# Patient Record
Sex: Female | Born: 1995 | Race: Black or African American | Hispanic: No | Marital: Single | Smoking: Never smoker
Health system: Southern US, Community
[De-identification: ages and names within clinical notes are randomized; demographics above are authoritative.]

## PROBLEM LIST (undated history)

## (undated) DIAGNOSIS — E063 Autoimmune thyroiditis: Secondary | ICD-10-CM

## (undated) DIAGNOSIS — L732 Hidradenitis suppurativa: Secondary | ICD-10-CM

## (undated) DIAGNOSIS — L83 Acanthosis nigricans: Secondary | ICD-10-CM

## (undated) DIAGNOSIS — E049 Nontoxic goiter, unspecified: Secondary | ICD-10-CM

## (undated) DIAGNOSIS — R519 Headache, unspecified: Secondary | ICD-10-CM

## (undated) DIAGNOSIS — R51 Headache: Secondary | ICD-10-CM

## (undated) DIAGNOSIS — R1013 Epigastric pain: Secondary | ICD-10-CM

## (undated) DIAGNOSIS — J45909 Unspecified asthma, uncomplicated: Secondary | ICD-10-CM

## (undated) DIAGNOSIS — E301 Precocious puberty: Secondary | ICD-10-CM

## (undated) DIAGNOSIS — R7303 Prediabetes: Secondary | ICD-10-CM

## (undated) DIAGNOSIS — I10 Essential (primary) hypertension: Secondary | ICD-10-CM

## (undated) DIAGNOSIS — E119 Type 2 diabetes mellitus without complications: Secondary | ICD-10-CM

## (undated) DIAGNOSIS — O24419 Gestational diabetes mellitus in pregnancy, unspecified control: Secondary | ICD-10-CM

## (undated) HISTORY — DX: Autoimmune thyroiditis: E06.3

## (undated) HISTORY — DX: Essential (primary) hypertension: I10

## (undated) HISTORY — DX: Headache, unspecified: R51.9

## (undated) HISTORY — DX: Epigastric pain: R10.13

## (undated) HISTORY — PX: KNEE ARTHROSCOPY: SUR90

## (undated) HISTORY — DX: Acanthosis nigricans: L83

## (undated) HISTORY — DX: Precocious puberty: E30.1

## (undated) HISTORY — DX: Nontoxic goiter, unspecified: E04.9

## (undated) HISTORY — DX: Hidradenitis suppurativa: L73.2

## (undated) HISTORY — DX: Prediabetes: R73.03

## (undated) HISTORY — DX: Headache: R51

---
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
## 2016-01-25 ENCOUNTER — Ambulatory Visit (INDEPENDENT_AMBULATORY_CARE_PROVIDER_SITE_OTHER): Payer: Medicaid Other | Admitting: Pediatrics

## 2016-01-25 ENCOUNTER — Encounter: Payer: Self-pay | Admitting: Pediatrics

## 2016-01-25 VITALS — BP 114/60 | HR 96 | Ht 68.25 in | Wt 247.8 lb

## 2016-01-25 DIAGNOSIS — G43009 Migraine without aura, not intractable, without status migrainosus: Secondary | ICD-10-CM | POA: Diagnosis not present

## 2016-01-25 MED ORDER — RELPAX 40 MG PO TABS: ORAL_TABLET | ORAL | Status: DC

## 2016-01-25 NOTE — Progress Notes (Signed)
Patient: Kathryn [name] MRN: [id number] Sex: female DOB: [birth year]  Provider: Deetta Perla, MD Location of Care: [facility]  Note type: Routine return visit  History of Present Illness: Referral Source: Dr. Dahlia Byes History from: patient and [facility] chart Chief Complaint: Migraines  [name] is a 20 y.o. female who returns on January 25, 2016 for the first time since Apr 05, 2015.  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  At the time I last saw her, she had new daily persistent headache.  We had to admit her to the hospital for DHE protocol.  Interestingly following that, her headaches markedly improved.  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  Her grade point average her freshman year is 3.3, which I think is very good as she adjusts from high school to college.    She tells me that her headaches occur about once or twice a week, but they are not particularly severe.  She has used topiramate as an abortive medication when it should be used as a preventative.  She is not taking it on a regular daily basis.  Given that she is not having frequent migraines, I told her to stop a topiramate because she was not taking it daily.  She had one bad headache week or so ago that was associated with sinusitis with severe headaches for at least two days until the antibiotic began to work.  She has treated her headaches with ibuprofen and when severe, with Relpax.  This is provided relief from her headaches over period of about an hour or so.  Her other medical problems include obesity, acanthosis, and autoimmune thyroiditis.  She has a prediabetic state, asthma and allergic rhinitis, and has had hypertension in the past.  She has gained 20 pounds with no change in height in the past 10 months.  I did not dwell on that, but it is in all likelihood her most significant medical  problem.  Review of Systems: 12 system review was assessed and except as noted above was otherwise normal  Past Medical History Diagnosis Date  . Pre-diabetes   . Hypertension   . Isosexual precocity   . Acanthosis nigricans   . Goiter   . Dyspepsia   . Thyroiditis, autoimmune   . Headache   . Asthma    Hospitalizations: No., Head Injury: No., Nervous System Infections: No., Immunizations up to date: Yes.    Birth History Term infant born at [redacted] weeks gestational age to a 19 year old g 1 p 0 female.  Behavior History none  Surgical History History reviewed. No pertinent past surgical history.  Family History family history includes Alcohol abuse in her father; Asthma in her brother, mother, and sister; COPD in her maternal grandfather; Cancer in her maternal grandmother; Depression in her maternal grandfather; Drug abuse in her father; Heart disease in her paternal grandmother; Hypertension in her maternal grandmother and paternal grandmother; Learning disabilities in her maternal grandfather; Mental illness in her brother. Family history is negative for migraines, seizures, intellectual disabilities, blindness, deafness, birth defects, chromosomal disorder, or autism.  Social History . Marital Status: Single    Spouse Name: N/A  . Number of Children: N/A  . Years of Education: N/A   Social History Main Topics  . Smoking status: Passive Smoke Exposure - Never Smoker  . Smokeless tobacco: Never Used  . Alcohol Use: No  .  Drug Use: No  . Sexual Activity: Yes    Birth Control/ Protection: Condom   Social History Narrative    [name] is a [employer]man at [employer]. She is doing well. She lives with her mother. She has 4 siblings, 24, 24, 8, and 19 yo. She enjoys dancing and exercise.   No Known Allergies  Physical Exam BP 114/60 mmHg  Pulse 96  Ht 5' 8.25" (1.734 m)  Wt 247 lb 12.8 oz (112.401 kg)  BMI 37.38 kg/m2  LMP 01/03/2016  (Approximate)  General: alert, well developed, obese, in no acute distress, black hair, brown eyes, right handed Head: normocephalic, no dysmorphic features Ears, Nose and Throat: Otoscopic: tympanic membranes normal; pharynx: oropharynx is pink without exudates or tonsillar hypertrophy Neck: supple, full range of motion, no cranial or cervical bruits Respiratory: auscultation clear Cardiovascular: no murmurs, pulses are normal Musculoskeletal: no skeletal deformities or apparent scoliosis Skin: no rashes or neurocutaneous lesions  Neurologic Exam  Mental Status: alert; oriented to person, place and year; knowledge is normal for age; language is normal Cranial Nerves: visual fields are full to double simultaneous stimuli; extraocular movements are full and conjugate; pupils are round reactive to light; funduscopic examination shows sharp disc margins with normal vessels; symmetric facial strength; midline tongue and uvula; air conduction is greater than bone conduction bilaterally Motor: Normal strength, tone and mass; good fine motor movements; no pronator drift Sensory: intact responses to cold, vibration, proprioception and stereognosis Coordination: good finger-to-nose, rapid repetitive alternating movements and finger apposition Gait and Station: normal gait and station: patient is able to walk on heels, toes and tandem without difficulty; balance is adequate; Romberg exam is negative; Gower response is negative Reflexes: symmetric and diminished bilaterally; no clonus; bilateral flexor plantar responses  Assessment 1.  Migraine without aura and without status migrainosus, not intractable, G43.009.  Discussion I am pleased that migraines have significantly declined.  I asked her to keep a daily prospective headache calendar so that she will know how often she has severe headaches, which will allow me to properly treat her.  Plan I recommended that she discontinue topiramate.  I wrote  a prescription for Relpax, trade drug.  I asked her to sign up for my chart so that she could communicate with me when she is at school.  She will return to see me in five months before she returns to school.  I spent 30 minutes face-to-face time with Aireana, more than half of it in consultation.   Medication List   This list is accurate as of: 01/25/16 11:59 PM.       albuterol (2.5 MG/3ML) 0.083% nebulizer solution  Commonly known as:  PROVENTIL  Take 2.5 mg by nebulization every 6 (six) hours as needed for wheezing or shortness of breath.     PROAIR HFA 108 (90 Base) MCG/ACT inhaler  Generic drug:  albuterol  Inhale 2 puffs into the lungs every 4 (four) hours as needed for wheezing or shortness of breath.     beclomethasone 80 MCG/ACT inhaler  Commonly known as:  QVAR  Inhale 2 puffs into the lungs 2 (two) times daily.     cetirizine 10 MG tablet  Commonly known as:  ZYRTEC  Take 10 mg by mouth daily.     MULTIVITAMIN GUMMIES ADULTS PO  Take 1 tablet by mouth daily.     PATADAY 0.2 % Soln  Generic drug:  Olopatadine HCl  Place 1 drop into both eyes daily.  RELPAX 40 MG tablet  Generic drug:  eletriptan  Take one tablet at onset of migraine with 400 mg of ibuprofen may repeat in 2 hours if headache persists or recurs.      The medication list was reviewed and reconciled. All changes or newly prescribed medications were explained.  A complete medication list was provided to the patient/caregiver.  Deetta Perla MD

## 2016-02-06 ENCOUNTER — Other Ambulatory Visit: Payer: Self-pay | Admitting: Family

## 2016-06-04 ENCOUNTER — Encounter: Payer: Self-pay | Admitting: Sports Medicine

## 2016-06-04 ENCOUNTER — Ambulatory Visit (INDEPENDENT_AMBULATORY_CARE_PROVIDER_SITE_OTHER): Payer: Medicaid Other | Admitting: Sports Medicine

## 2016-06-04 ENCOUNTER — Ambulatory Visit (INDEPENDENT_AMBULATORY_CARE_PROVIDER_SITE_OTHER): Payer: Medicaid Other

## 2016-06-04 VITALS — BP 128/71 | HR 88 | Resp 16 | Ht 68.0 in | Wt 250.0 lb

## 2016-06-04 DIAGNOSIS — Q6650 Congenital pes planus, unspecified foot: Secondary | ICD-10-CM

## 2016-06-04 DIAGNOSIS — M79673 Pain in unspecified foot: Secondary | ICD-10-CM

## 2016-06-04 MED ORDER — DICLOFENAC SODIUM 75 MG PO TBEC: 75.0000 mg | DELAYED_RELEASE_TABLET | Freq: Every day | ORAL | 0 refills | Status: DC

## 2016-06-04 NOTE — Patient Instructions (Signed)
Flat Feet Having flat feet is a common condition. One foot or both might be affected. People of any age can have flat feet. In fact, everyone is born with them. But most of the time, the foot gradually develops an arch. That is the curve on the bottom of the foot that creates a gap between the foot and the ground. An arch usually develops in childhood. Sometimes, though, an arch never develops and the foot stays flat on the bottom. Other times, an arch develops but later collapses (caves in). That is what gives the condition its nickname, "fallen arches." The medical term for flat feet is pes planus. Some people have flat feet their whole life and have no problems. For others, the condition causes pain and needs to be corrected.  CAUSES   A problem with the foot's soft tissue; tendons and ligaments could be loose.  This can cause what is called flexible flat feet. That means the shape of the foot changes with pressure. When standing on the toes, a curved arch can be seen. When standing on the ground, the foot is flat.  Wear and tear. Sometimes arches simply flatten over time.  Damage to the posterior tibial tendon. This is the tendon that goes from the inside of the ankle to the bones in the middle of the foot. It is the main support for the arch. If the tendon is injured, stretched or torn, the arch might flatten.  Tarsal coalition. With this condition, two or more bones in the foot are joined together (fused ) during development in the womb. This limits movement and can lead to a flat foot. SYMPTOMS   The foot is even with the ground from toe to heel. Your caregiver will look closely at the inside of the foot while you are standing.  Pain along the bottom of the foot. Some people describe the pain as tightness.  Swelling on the inside of the foot or ankle.  Changes in the way you walk (gait).  The feet lean inward, starting at the ankle (pronation). DIAGNOSIS  To decide if a child or  adult has flat feet, a healthcare provider will probably:  Do a physical examination. This might include having the person stand on his or her toes and then stand normally. The caregiver will also hold the foot and put pressure on the foot in different directions.  Check the person's shoes. The pattern of wear on the soles can offer clues.  Order images (pictures) of the foot. They can help identify the cause of any pain. They also will show injuries to bones or tendons that could be causing the condition. The images can come from:  X-rays.  Computed tomography (CT) scan. This combines X-ray and a computer.  Magnetic resonance imaging (MRI). This uses magnets, radio waves and a computer to take a picture of the foot. It is the best technique to evaluate tendons, ligaments and muscles. TREATMENT   Flexible flat feet usually are painless. Most of the time, gait is not affected. Most children grow out of the condition. Often no treatment is needed. If there is pain, treatment options include:  Orthotics. These are inserts that go in the shoes. They add support and shape to the feet. An orthotic is custom-made from a mold of the foot.  Shoes. Not all shoes are the same. People with flat feet need arch support. However, too much can be painful. It is important to find shoes that offer the right amount   of support. Athletes, especially runners, may need to try shoes made just for people with flatter feet.  Medication. For pain, only take over-the-counter medicine for pain, discomfort, as directed by your caregiver.  Rest. If the feet start to hurt, cut back on the exercise which increases the pain. Use common sense.  For damage to the posterior tibial tendon, options include:  Orthotics. Also adding a wedge on the inside edge may help. This can relieve pressure on the tendon.  Ankle brace, boot or cast. These supports can ease the load on the tendon while it heals.  Surgery. If the tendon is  torn, it might need to be repaired.  For tarsal coalition, similar options apply:  Pain medication.  Orthotics.  A cast and crutches. This keeps weight off the foot.  Physical therapy.  Surgery to remove the bone bridge joining the two bones together. PROGNOSIS  In most people, flat feet do not cause pain or problems. People can go about their normal activities. However, if flat feet are painful, they can and should be treated. Treatment usually relieves the pain. HOME CARE INSTRUCTIONS   Take any medications prescribed by the healthcare provider. Follow the directions carefully.  Wear, or make sure a child wears, orthotics or special shoes if this was suggested. Be sure to ask how often and for how long they should be worn.  Do any exercises or therapy treatments that were suggested.  Take notes on when the pain occurs. This will help healthcare providers decide how to treat the condition.  If surgery is needed, be sure to find out if there is anything that should or should not be done before the operation. SEEK MEDICAL CARE IF:   Pain worsens in the foot or lower leg.  Pain disappears after treatment, but then returns.  Walking or simple exercise becomes difficult or causes foot pain.  Orthotics or special shoes are uncomfortable or painful.   This information is not intended to replace advice given to you by your health care provider. Make sure you discuss any questions you have with your health care provider.   Document Released: 08/25/2009 Document Revised: 01/20/2012 Document Reviewed: 04/26/2015 Elsevier Interactive Patient Education 2016 [employer]  

## 2016-06-05 NOTE — Progress Notes (Signed)
Subjective: Kathryn [name] is a 20 y.o. female patient who presents to office for evaluation of bilateral foot pain. Patient complains of progressive changes to feet over the years from flatfoot type. Patient admits pain that starts as fatigue in the medial arch. Admits also to swelling at end of the day at feet and ankles. Pain is now interferring with daily activities. States that it is worse after work.  Patient has tried inserts in the past that seemed to help for a little bit. Patient denies any other pedal complaints.   Denies history of arthridities or history of birth defects. All normal birth and devlopemental milestones.   Patient Active Problem List   Diagnosis Date Noted  . Status migrainosus 04/17/2015  . 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 New daily persistent headache 09/09/2014  . Migraine without aura and without status migrainosus, not intractable 09/09/2014  . Obesity 09/09/2014  . Isosexual precocity   . Acanthosis nigricans   . Goiter   . Dyspepsia   . Thyroiditis, autoimmune   . Goiter, unspecified 03/04/2011  . Pre-diabetes 03/04/2011  . Hypertension 03/04/2011   Current Outpatient Prescriptions on File Prior to Visit  Medication Sig Dispense Refill  . albuterol (PROVENTIL) (2.5 MG/3ML) 0.083% nebulizer solution Take 2.5 mg by nebulization every 6 (six) hours as needed for wheezing or shortness of breath.    . beclomethasone (QVAR) 80 MCG/ACT inhaler Inhale 2 puffs into the lungs 2 (two) times daily.    . cetirizine (ZYRTEC) 10 MG tablet Take 10 mg by mouth daily.    . Multiple Vitamins-Minerals (MULTIVITAMIN GUMMIES ADULTS PO) Take 1 tablet by mouth daily.    Marland Kitchen PATADAY 0.2 % SOLN Place 1 drop into both eyes daily.   6  . PROAIR HFA 108 (90 BASE) MCG/ACT inhaler Inhale 2 puffs into the lungs every 4 (four) hours as needed for wheezing or shortness of breath.   0  . RELPAX 40 MG tablet Take one tablet at onset of migraine with 400 mg of ibuprofen may repeat in  2 hours if headache persists or recurs. 10 tablet 5  . topiramate (TOPAMAX) 25 MG tablet TAKE 2 TABLETS IN THE MORNING AND 3 TABLETS AT NIGHTTIME 150 tablet 3   No current facility-administered medications on file prior to visit.    No Known Allergies   Objective:  General: Alert and oriented x3 in no acute distress  Dermatology: No open lesions bilateral lower extremities, no webspace macerations, no ecchymosis bilateral, all nails x 10 are well manicured.  Vascular: Dorsalis Pedis and Posterior Tibial pedal pulses 2/4, Capillary Fill Time 3 seconds, (+) pedal hair growth bilateral, no edema bilateral lower extremities, Temperature gradient within normal limits.  Neurology: Michaell Cowing sensation intact via light touch bilateral, Protective sensation intact  with Semmes Weinstein Monofilament to all pedal sites, Position sense intact, vibratory intact bilateral, Deep tendon reflexes within normal limits bilateral, No babinski sign present bilateral. (-) Tinels sign bilateral.   Musculoskeletal: Minimal tenderness with palpation along medial arch, No tenderness to medial fascial band, Minimal tenderness along Posterior tibial tendon course with minimal medial soft tissue buldge noted, there is mild decreased ankle rom with knee extending  vs flexed resembling gastroc equnius bilateral, Subtalar joint range of motion is within normal limits, there is mild 1st ray hypermobility noted bilateral, MTPJ ROM within normal limits, there is medial arch collapse bilateral on weightbearing exam, moderate RF valgus bilateral, + "too-many toes" sign appreciated, able to perform heel rise test without  pain.  Gait: Non-Antalgic gait with increased medial arch collapse and pronatory influence noted bilateral with medial 1st MPJ roll off in toe-off.   Xrays Right and Left foot:  Normal osseous mineralization. Joint spaces preserved. No fracture/dislocation/boney destruction. Mild 1st ray elevatus present. Increased  Talar head uncovering present. Anterior break in cyma line with midtarsal breach present. Increased Talar declination present. Decreased calcaneal inclination present.  No soft tissue abnormalities or radiopaque foreign bodies.   Assessment and Plan: Problem List Items Addressed This Visit    None    Visit Diagnoses    Foot pain, unspecified laterality    -  Primary   Relevant Medications   diclofenac (VOLTAREN) 75 MG EC tablet   Other Relevant Orders   DG Foot 2 Views Left   DG Foot 2 Views Right   Congenital pes planus, unspecified laterality       Relevant Medications   diclofenac (VOLTAREN) 75 MG EC tablet      -Complete examination performed -Xrays reviewed -Discussed treatement options; discussed pes planus deformity;conservative and  surgical  -Prescribed diclofenac to take as instructed -Rx Orthotics from [facility] -Recommend good supportive shoes -Recommend daily stretching and icing and Epsom salt soaks as needed -Recommend Motrin or Tylenol as needed -Patient to return to office as needed or sooner if condition worsens.  Asencion Islam, DPM

## 2016-06-14 ENCOUNTER — Telehealth: Payer: Self-pay | Admitting: *Deleted
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
## 2016-06-14 NOTE — Telephone Encounter (Signed)
A copy should be on file of the prescription from [facility] for [place] to give her a new copy. If not, can write [facility] custom orthotics. Medical justification symptomatic pes planus. Thanks Dr. Marylene Land

## 2016-06-17 ENCOUNTER — Other Ambulatory Visit: Payer: Self-pay | Admitting: Family

## 2016-06-17 ENCOUNTER — Other Ambulatory Visit: Payer: Self-pay | Admitting: Sports Medicine

## 2016-06-17 DIAGNOSIS — Q6650 Congenital pes planus, unspecified foot: Secondary | ICD-10-CM

## 2016-06-17 DIAGNOSIS — M79673 Pain in unspecified foot: Secondary | ICD-10-CM

## 2016-06-19 ENCOUNTER — Telehealth: Payer: Self-pay | Admitting: *Deleted

## 2016-06-19 MED ORDER — DICLOFENAC SODIUM 75 MG PO TBEC: DELAYED_RELEASE_TABLET | ORAL | 1 refills | Status: DC

## 2016-06-19 NOTE — Telephone Encounter (Signed)
Pt requested refill of the Diclofenac.  I informed pt Dr. Theodoro DoingStover okayed the refill +one additional, pt needs an appt prior to future refills.

## 2016-06-30 ENCOUNTER — Other Ambulatory Visit: Payer: Self-pay | Admitting: Sports Medicine

## 2016-06-30 DIAGNOSIS — M79673 Pain in unspecified foot: Secondary | ICD-10-CM

## 2016-06-30 DIAGNOSIS — Q6650 Congenital pes planus, unspecified foot: Secondary | ICD-10-CM

## 2016-07-11 ENCOUNTER — Telehealth: Payer: Self-pay | Admitting: *Deleted

## 2016-07-11 NOTE — Telephone Encounter (Signed)
FAXED RX FOR DMA, MEDICAL NECESSITY FORMS AND 06/04/2106 CLINICAL NOTE TO [facility].

## 2016-07-12 ENCOUNTER — Encounter: Payer: Self-pay | Admitting: Pediatrics

## 2016-10-01 DIAGNOSIS — Y939 Activity, unspecified: Secondary | ICD-10-CM | POA: Insufficient documentation

## 2016-10-01 DIAGNOSIS — Z7722 Contact with and (suspected) exposure to environmental tobacco smoke (acute) (chronic): Secondary | ICD-10-CM | POA: Insufficient documentation

## 2016-10-01 DIAGNOSIS — M7051 Other bursitis of knee, right knee: Secondary | ICD-10-CM | POA: Insufficient documentation

## 2016-10-01 DIAGNOSIS — I10 Essential (primary) hypertension: Secondary | ICD-10-CM | POA: Insufficient documentation

## 2016-10-01 DIAGNOSIS — J45909 Unspecified asthma, uncomplicated: Secondary | ICD-10-CM | POA: Insufficient documentation

## 2016-10-02 ENCOUNTER — Emergency Department (HOSPITAL_COMMUNITY): Payer: Self-pay

## 2016-10-02 ENCOUNTER — Encounter (HOSPITAL_COMMUNITY): Payer: Self-pay | Admitting: Emergency Medicine

## 2016-10-02 ENCOUNTER — Emergency Department (HOSPITAL_COMMUNITY)
Admission: EM | Admit: 2016-10-02 | Discharge: 2016-10-02 | Disposition: A | Discharge status: Home / Self Care | Payer: Self-pay | Attending: Emergency Medicine | Admitting: Emergency Medicine

## 2016-10-02 DIAGNOSIS — M7050 Other bursitis of knee, unspecified knee: Secondary | ICD-10-CM

## 2016-10-02 MED ORDER — PREDNISONE 20 MG PO TABS: 60.0000 mg | ORAL_TABLET | Freq: Once | ORAL | Status: DC

## 2016-10-02 MED ORDER — OXYCODONE-ACETAMINOPHEN 5-325 MG PO TABS
2.0000 | ORAL_TABLET | Freq: Once | ORAL | Status: AC
  Administered 2016-10-02: 2 via ORAL
  Filled 2016-10-02: qty 2

## 2016-10-02 MED ORDER — PREDNISONE 20 MG PO TABS: 60.0000 mg | ORAL_TABLET | Freq: Every day | ORAL | 0 refills | Status: AC

## 2016-10-02 NOTE — Discharge Instructions (Signed)
Today, your x-ray does not show any fracture or effusion.  You're being treated for a bursitis which is an inflammation of the fluid sacs that lubricate your knee joint.  Please take the medication as directed until all tablets have been consumed.  Continue wearing the brace for comfort.  As the swelling decreases, you may need less support been given a referral to an orthopedic surgeon for further evaluation if you do not have total resolution of your symptoms in the next 1-2 weeks

## 2016-10-02 NOTE — ED Notes (Signed)
Patient is alert and oriented x3.  She was given DC instructions and follow up visit instructions.  Patient gave verbal understanding. She was DC ambulatory under her own power to home.  V/S stable.  He was not showing any signs of distress on DC 

## 2016-10-02 NOTE — ED Triage Notes (Signed)
Patient was at [facility] two weeks ago and they told her she has fluid on her knee. Patient is in pain on the right knee

## 2016-10-02 NOTE — ED Provider Notes (Signed)
[facility]-EMERGENCY DEPT Provider Note   CSN: [id number] Arrival date & time: 10/01/16  2346     History   Chief Complaint Chief Complaint  Patient presents with  . Joint Swelling    HPI Kathryn [name] is a 20 y.o. female.  This is a morbidly obese 20 year old female who noted 2 weeks ago that she had right knee swelling that is progressively gotten worse.  [facility] and given Naprosyn and hydrocodone, and a knee brace with ejections to follow-up, which she has not done as of yet due to school schedule.  Denies any DVT risk factors.  Denies trauma or previous injury to this knee.      Past Medical History:  Diagnosis Date  . Acanthosis nigricans   . Asthma   . Dyspepsia   . Goiter   . Headache   . Hypertension   . Isosexual precocity   . Pre-diabetes   . Thyroiditis, autoimmune     Patient Active Problem List   Diagnosis Date Noted  . Status migrainosus 04/17/2015  . 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 New daily persistent headache 09/09/2014  . Migraine without aura and without status migrainosus, not intractable 09/09/2014  . Obesity 09/09/2014  . Isosexual precocity   . Acanthosis nigricans   . Goiter   . Dyspepsia   . Thyroiditis, autoimmune   . Goiter, unspecified 03/04/2011  . Pre-diabetes 03/04/2011  . Hypertension 03/04/2011    History reviewed. No pertinent surgical history.  OB History    No data available       Home Medications    Prior to Admission medications   Medication Sig Start Date End Date Taking? Authorizing Provider  albuterol (PROVENTIL) (2.5 MG/3ML) 0.083% nebulizer solution Take 2.5 mg by nebulization every 6 (six) hours as needed for wheezing or shortness of breath.    Historical Provider, MD  beclomethasone (QVAR) 80 MCG/ACT inhaler Inhale 2 puffs into the lungs 2 (two) times daily.    Historical Provider, MD  cetirizine (ZYRTEC) 10 MG tablet Take 10 mg by mouth daily.    Historical  Provider, MD  diclofenac (VOLTAREN) 75 MG EC tablet Take 1 tablet (75 mg total) by mouth daily. 06/04/16   Asencion Islam, DPM  diclofenac (VOLTAREN) 75 MG EC tablet Take one tablet daily. 06/19/16   Asencion Islam, DPM  Multiple Vitamins-Minerals (MULTIVITAMIN GUMMIES ADULTS PO) Take 1 tablet by mouth daily.    Historical Provider, MD  PATADAY 0.2 % SOLN Place 1 drop into both eyes daily.  11/14/14   Historical Provider, MD  predniSONE (DELTASONE) 20 MG tablet Take 3 tablets (60 mg total) by mouth daily with breakfast. 10/02/16 10/07/16  Earley Favor, NP  PROAIR HFA 108 (90 BASE) MCG/ACT inhaler Inhale 2 puffs into the lungs every 4 (four) hours as needed for wheezing or shortness of breath.  02/24/15   Historical Provider, MD  RELPAX 40 MG tablet Take one tablet at onset of migraine with 400 mg of ibuprofen may repeat in 2 hours if headache persists or recurs. 01/25/16   Deetta Perla, MD  topiramate (TOPAMAX) 25 MG tablet TAKE 2 TABLETS IN THE MORNING AND 3 TABLETS AT NIGHTTIME 02/06/16   Elveria Rising, NP    Family History Family History  Problem Relation Age of Onset  . Heart disease Paternal Grandmother   . Hypertension Paternal Grandmother   . Asthma Mother   . Alcohol abuse Father   . Drug abuse Father   .  Asthma Sister   . Asthma Brother   . Mental illness Brother   . Cancer Maternal Grandmother   . Hypertension Maternal Grandmother   . COPD Maternal Grandfather   . Depression Maternal Grandfather   . Learning disabilities Maternal Grandfather     Social History Social History  Substance Use Topics  . Smoking status: Passive Smoke Exposure - Never Smoker  . Smokeless tobacco: Never Used  . Alcohol use No     Allergies   Patient has no known allergies.   Review of Systems Review of Systems  Constitutional: Negative for fever.  Musculoskeletal: Positive for joint swelling.  Neurological: Negative for numbness.  All other systems reviewed and are  negative.    Physical Exam Updated Vital Signs BP 129/70 (BP Location: Right Arm)   Pulse 89   Temp 98.3 F (36.8 C) (Oral)   Resp 17   Ht 5\' 9"  (1.753 m)   Wt 108 kg   LMP 09/17/2016 (Approximate)   SpO2 98%   BMI 35.15 kg/m   Physical Exam  Constitutional: She appears well-developed and well-nourished.  Eyes: Pupils are equal, round, and reactive to light.  Neck: Normal range of motion.  Cardiovascular: Normal rate.   Pulmonary/Chest: Effort normal.  Musculoskeletal: She exhibits edema and tenderness. She exhibits no deformity.       Right knee: She exhibits decreased range of motion, swelling and erythema. She exhibits no effusion, no deformity and no laceration. Tenderness found.       Legs: Neurological: She is alert.  Psychiatric: She has a normal mood and affect.  Nursing note and vitals reviewed.    ED Treatments / Results  Labs (all labs ordered are listed, but only abnormal results are displayed) Labs Reviewed - No data to display  EKG  EKG Interpretation None       Radiology Dg Knee 2 Views Right  Result Date: 10/02/2016 CLINICAL DATA:  No injury, patient states her knee has been swollen and painful for two weeks, states she was told at another facility that she had fluid on her knee, woke up tonight and is unable to bear weight, entire knee is painful EXAM: RIGHT KNEE - 1-2 VIEW COMPARISON:  None. FINDINGS: No evidence of fracture, dislocation, or joint effusion. No evidence of arthropathy or other focal bone abnormality. Soft tissues are unremarkable. IMPRESSION: Negative. Electronically Signed   By: Burman NievesWilliam  Stevens M.D.   On: 10/02/2016 03:24    Procedures Procedures (including critical care time)  Medications Ordered in ED Medications  predniSONE (DELTASONE) tablet 60 mg (not administered)  oxyCODONE-acetaminophen (PERCOCET/ROXICET) 5-325 MG per tablet 2 tablet (2 tablets Oral Given 10/02/16 0346)     Initial Impression / Assessment and  Plan / ED Course  I have reviewed the triage vital signs and the nursing notes.  Pertinent labs & imaging results that were available during my care of the patient were reviewed by me and considered in my medical decision making (see chart for details).  Clinical Course      X-rays been reviewed.  There is no effusion noted on x-ray due to body habitus.  Physical exam is difficult.  The joint is warm and swollen.  DVT risk factors are 0.  She'll be treated for bursitis with a 5 day burst of steroids, continued with her hydrocodone for pain control and the knee brace.  She's also been given a referral to orthopedic surgery for follow-up if she doesn't have complete resolution of her symptoms in  the next 1-2 weeks  Final Clinical Impressions(s) / ED Diagnoses   Final diagnoses:  Bursitis of knee, unspecified bursa, unspecified laterality    New Prescriptions New Prescriptions   PREDNISONE (DELTASONE) 20 MG TABLET    Take 3 tablets (60 mg total) by mouth daily with breakfast.     Earley FavorGail Sedrick Tober, NP 10/02/16 [id number]    Tomasita CrumbleAdeleke Oni, MD 10/02/16 [phone]

## 2016-10-30 ENCOUNTER — Ambulatory Visit (INDEPENDENT_AMBULATORY_CARE_PROVIDER_SITE_OTHER): Payer: Self-pay

## 2016-11-20 ENCOUNTER — Ambulatory Visit (INDEPENDENT_AMBULATORY_CARE_PROVIDER_SITE_OTHER): Payer: BLUE CROSS/BLUE SHIELD | Admitting: Pediatrics

## 2016-11-20 ENCOUNTER — Encounter (INDEPENDENT_AMBULATORY_CARE_PROVIDER_SITE_OTHER): Payer: Self-pay | Admitting: Pediatrics

## 2016-11-20 ENCOUNTER — Encounter: Payer: Self-pay | Admitting: Primary Care

## 2016-11-20 ENCOUNTER — Ambulatory Visit (INDEPENDENT_AMBULATORY_CARE_PROVIDER_SITE_OTHER): Payer: BLUE CROSS/BLUE SHIELD | Admitting: Primary Care

## 2016-11-20 VITALS — BP 110/70 | HR 92 | Ht 69.0 in | Wt 255.0 lb

## 2016-11-20 VITALS — BP 124/86 | HR 77 | Temp 98.2°F | Ht 69.0 in | Wt 255.4 lb

## 2016-11-20 DIAGNOSIS — J4540 Moderate persistent asthma, uncomplicated: Secondary | ICD-10-CM | POA: Diagnosis not present

## 2016-11-20 DIAGNOSIS — R7303 Prediabetes: Secondary | ICD-10-CM | POA: Diagnosis not present

## 2016-11-20 DIAGNOSIS — Z6837 Body mass index (BMI) 37.0-37.9, adult: Secondary | ICD-10-CM

## 2016-11-20 DIAGNOSIS — E6609 Other obesity due to excess calories: Secondary | ICD-10-CM | POA: Diagnosis not present

## 2016-11-20 DIAGNOSIS — K219 Gastro-esophageal reflux disease without esophagitis: Secondary | ICD-10-CM | POA: Diagnosis not present

## 2016-11-20 DIAGNOSIS — G43009 Migraine without aura, not intractable, without status migrainosus: Secondary | ICD-10-CM

## 2016-11-20 DIAGNOSIS — E049 Nontoxic goiter, unspecified: Secondary | ICD-10-CM | POA: Diagnosis not present

## 2016-11-20 DIAGNOSIS — I10 Essential (primary) hypertension: Secondary | ICD-10-CM

## 2016-11-20 DIAGNOSIS — E063 Autoimmune thyroiditis: Secondary | ICD-10-CM

## 2016-11-20 DIAGNOSIS — L83 Acanthosis nigricans: Secondary | ICD-10-CM

## 2016-11-20 DIAGNOSIS — R1013 Epigastric pain: Secondary | ICD-10-CM | POA: Diagnosis not present

## 2016-11-20 DIAGNOSIS — G4452 New daily persistent headache (NDPH): Secondary | ICD-10-CM | POA: Diagnosis not present

## 2016-11-20 LAB — COMPREHENSIVE METABOLIC PANEL
ALK PHOS: 76 U/L (ref 47–119)
ALT: 13 U/L (ref 0–35)
AST: 14 U/L (ref 0–37)
Albumin: 4.1 g/dL (ref 3.5–5.2)
BILIRUBIN TOTAL: 0.4 mg/dL (ref 0.2–1.2)
BUN: 10 mg/dL (ref 6–23)
CO2: 29 mEq/L (ref 19–32)
Calcium: 9.9 mg/dL (ref 8.4–10.5)
Chloride: 103 mEq/L (ref 96–112)
Creatinine, Ser: 0.77 mg/dL (ref 0.40–1.20)
GFR: 123.53 mL/min (ref >60.00)
GLUCOSE: 90 mg/dL (ref 70–99)
Potassium: 3.9 mEq/L (ref 3.5–5.1)
SODIUM: 138 meq/L (ref 135–145)
TOTAL PROTEIN: 8 g/dL (ref 6.0–8.3)

## 2016-11-20 LAB — TSH: TSH: 1.53 u[IU]/mL (ref 0.40–5.00)

## 2016-11-20 LAB — HEMOGLOBIN A1C: Hgb A1c MFr Bld: 6 % (ref 4.6–6.5)

## 2016-11-20 MED ORDER — TOPIRAMATE 25 MG PO TABS: ORAL_TABLET | ORAL | 5 refills | Status: DC

## 2016-11-20 MED ORDER — BECLOMETHASONE DIPROPIONATE 80 MCG/ACT IN AERS: 2.0000 | INHALATION_SPRAY | Freq: Two times a day (BID) | RESPIRATORY_TRACT | 5 refills | Status: DC

## 2016-11-20 MED ORDER — RELPAX 40 MG PO TABS: ORAL_TABLET | ORAL | 5 refills | Status: DC

## 2016-11-20 MED ORDER — PROAIR HFA 108 (90 BASE) MCG/ACT IN AERS: 2.0000 | INHALATION_SPRAY | Freq: Four times a day (QID) | RESPIRATORY_TRACT | 5 refills | Status: DC | PRN

## 2016-11-20 MED ORDER — OMEPRAZOLE 10 MG PO CPDR: 10.0000 mg | DELAYED_RELEASE_CAPSULE | Freq: Every day | ORAL | 1 refills | Status: DC

## 2016-11-20 NOTE — Assessment & Plan Note (Signed)
TSH checked today, normal.

## 2016-11-20 NOTE — Assessment & Plan Note (Signed)
TSH check today, normal.

## 2016-11-20 NOTE — Assessment & Plan Note (Signed)
A1C of 6.0 on today's labs. Strongly encouraged she improve her diet and start exercising. Follow up in 6 months.

## 2016-11-20 NOTE — Assessment & Plan Note (Signed)
Discussed to start exercising and improve diet. Continue to monitor.

## 2016-11-20 NOTE — Patient Instructions (Signed)
Your asthma is not well controlled.   Start using Qvar inhaler. Inhale 2 puffs into the lungs twice daily, everyday.  Use the albuterol every 6 hours as needed for wheezing/shortness of breath. Use this only as needed. If you start requiring this medication more than 2-3 times weekly, then you need to notify me.  I sent refills for omeprazole to your pharmacy.  Complete lab work prior to leaving today. I will notify you of your results once received.   Follow up in 6 months for re-evaluation.  It was a pleasure to meet you today! Please don't hesitate to call me with any questions. Welcome to [facility]!

## 2016-11-20 NOTE — Patient Instructions (Signed)
There are 3 lifestyle behaviors that are important to minimize headaches.  You should sleep 8 hours at night time.  Bedtime should be a set time for going to bed and waking up with few exceptions.  You need to drink about 48 ounces of water per day, more on days when you are out in the heat.  This works out to 3 - 16 ounce water bottles per day.  You may need to flavor the water so that you will be more likely to drink it.  Do not use Kool-Aid or other sugar drinks because they add empty calories and actually increase urine output.  You need to eat 3 meals per day.  You should not skip meals.  The meal does not have to be a big one.  Make daily entries into the headache calendar and sent it to me at the end of each calendar month.  I will call you or your parents and we will discuss the results of the headache calendar and make a decision about changing treatment if indicated.  You should take 400 mg of ibuprofen with 40 mg of relpax at the onset of headaches that are severe enough to cause obvious pain and other symptoms.

## 2016-11-20 NOTE — Assessment & Plan Note (Signed)
Improved on PPI, refill provided today. Discussed triggers of GERD and importance of weight loss.

## 2016-11-20 NOTE — Progress Notes (Signed)
Pre visit review using our clinic review tool, if applicable. No additional management support is needed unless otherwise documented below in the visit note. 

## 2016-11-20 NOTE — Progress Notes (Deleted)
HPI:               PE:

## 2016-11-20 NOTE — Progress Notes (Signed)
Patient: Kathryn [name] MRN: [id number] Sex: female DOB: Apr 22, 1996  Provider: Ellison Carwin, MD Location of Care: [facility]  Note type: Routine return visit  History of Present Illness: Referral Source: Dr. Dahlia Byes History from: patient and [facility] chart Chief Complaint: Migraines  Kathryn [name] is a 21 y.o. female who Ssnce her last visit in 01/25/2016 discontinued her Topamax until her migraines returned in November. The migraines are of the same quality and severity: Frontal to lateral, pounding pain associated with photo/phonophobia and blurry vision. Denies abdominal pain, nausea/vomiting, or dizziness with these headaches. They were initially occurring 3x/week but have increased to 1-2 every day. They last for several hours and usually occur during the day time. She doesn't complain of any "minor" headaches. She restarted her Topamax in November taking 50mg  pills in the AM and PM. She takes her Relpax 40mg  once every other day. Triggers include stress as she attends school, works, and participates in a variety of extracurricular activities. She hasn't missed any classes as she will "push through the day" with these headaches. Her gpa has gone from a 3.3 to a 2.9, which Maricia attributes to increase difficulty of her classes. She gets 4-5 hours of sleep per night and may nap during the day. Drinks 1-2 cups of coffee when she works. Has previously drank alcohol but has not had any in the last 2 months. Denies history of tobacco or recreational drug use.   Today, she has established care with a new primary clinic where her TSH, CMP, and A1C were collected (results still pending).   Review of Systems: 12 system review was remarkable for stressed about life; the remainder was assessed and was negative Past Medical History Diagnosis Date  . Acanthosis nigricans   . Asthma   . Dyspepsia   . Goiter   . Headache   . Hypertension   . Isosexual precocity   .  Pre-diabetes   . Thyroiditis, autoimmune    Hospitalizations: No., Head Injury: No., Nervous System Infections: No., Immunizations up to date: Yes.    Birth History Term infant born at [redacted] weeks gestational age to a 20 year old g 1 p 0 female.  Behavior History none  Surgical History History reviewed. No pertinent surgical history.  Family History family history includes Alcohol abuse in her father; Asthma in her brother, mother, and sister; COPD in her maternal grandfather; Cancer in her maternal grandmother; Depression in her maternal grandfather; Drug abuse in her father; Heart disease in her paternal grandmother; Hypertension in her maternal grandmother and paternal grandmother; Learning disabilities in her maternal grandfather; Mental illness in her brother. Family history is negative for migraines, seizures, intellectual disabilities, blindness, deafness, birth defects, chromosomal disorder, or autism.  Social History . Marital status: Single    Spouse name: N/A  . Number of children: N/A  . Years of education: N/A   Social History Main Topics  . Smoking status: Never Smoker  . Smokeless tobacco: Never Used  . Alcohol use No  . Drug use: No  . 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    She has 4 siblings, 24, 24, 8, and 70 yo.     She enjoys dancing and fishing.    Studying banking and finance.   No Known Allergies  Physical Exam BP 110/70   Pulse 92   Ht 5\' 9"  (  1.753 m)   Wt 255 lb (115.7 kg)   LMP 11/10/2016   BMI 37.66 kg/m   General: alert, well developed, well nourished, in no acute distress, black hair, brown eyes,  Head: normocephalic, no dysmorphic features Ears, Nose and Throat: Otoscopic: tympanic membranes normal; pharynx: oropharynx is pink without exudates or tonsillar hypertrophy Neck: supple, full range of motion, no cranial or cervical  bruits. Acanthosis nigricans appreciated on posterior aspect of neck.  Respiratory: auscultation clear Cardiovascular: no murmurs, pulses are normal Musculoskeletal: no skeletal deformities or apparent scoliosis Skin: no rashes or neurocutaneous lesions  Neurologic Exam  Mental Status: alert; oriented to person, place and year; knowledge is normal for age; language is normal Cranial Nerves: visual fields are full to double simultaneous stimuli; extraocular movements are full and conjugate; pupils are round reactive to light; funduscopic examination shows sharp disc margins with normal vessels; symmetric facial strength; midline tongue and uvula; air conduction is greater than bone conduction bilaterally Motor: Normal strength, tone and mass; good fine motor movements; no pronator drift Sensory: intact responses to cold, vibration, proprioception and stereognosis Coordination: good finger-to-nose, rapid repetitive alternating movements and finger apposition Gait and Station: normal gait and station: patient is able to walk on heels, toes and tandem without difficulty; balance is adequate; Romberg exam is negative; Gower response is negative Reflexes: symmetric and diminished bilaterally; no clonus; bilateral flexor plantar responses  Assessment 1.  Migraine without aura and without status migrainosus, not intractable, G43.009. 2.  New daily persistent headache, G44.52. 3.  Acanthosis nigricans, L 83. 4.  Class II obesity due to excess calories without serious comorbidity with BMI of 37.0-37.9, E66.09, Z68.37.  Discussion Achille Richaliyah is not doing well but is not taking very good care of herself in terms of sleep hydration or eating meals.  She needs to give daily prospective headache calendar so that I can see exactly how often she is having headaches and how severe they are.  Plan I asked her to sign up for My Chart and to send her calendars to me.  I asked her also to sleep 8 hours at night, to  drink 48 ounces of water per day and does not skip meals.   Medication List   Accurate as of 11/20/16  3:41 PM.      albuterol (2.5 MG/3ML) 0.083% nebulizer solution Commonly known as:  PROVENTIL Take 2.5 mg by nebulization every 6 (six) hours as needed for wheezing or shortness of breath.   PROAIR HFA 108 (90 Base) MCG/ACT inhaler Generic drug:  albuterol Inhale 2 puffs into the lungs every 6 (six) hours as needed for wheezing or shortness of breath.   beclomethasone 80 MCG/ACT inhaler Commonly known as:  QVAR Inhale 2 puffs into the lungs 2 (two) times daily.   cetirizine 10 MG tablet Commonly known as:  ZYRTEC Take 10 mg by mouth daily.   diclofenac 75 MG EC tablet Commonly known as:  VOLTAREN Take one tablet daily.   MULTIVITAMIN GUMMIES ADULTS PO Take 1 tablet by mouth daily.   omeprazole 10 MG capsule Commonly known as:  PRILOSEC Take 1 capsule (10 mg total) by mouth daily.   PATADAY 0.2 % Soln Generic drug:  Olopatadine HCl Place 1 drop into both eyes daily.   eletriptan 20 MG tablet Commonly known as:  RELPAX Take 20 mg by mouth as needed for migraine or headache. May repeat in 2 hours if headache persists or recurs.   RELPAX 40 MG tablet Generic drug:  eletriptan Take one tablet at onset of migraine with 400 mg of ibuprofen may repeat in 2 hours if headache persists or recurs.   tiZANidine 2 MG tablet Commonly known as:  ZANAFLEX Take 2 mg by mouth daily.   topiramate 25 MG tablet Commonly known as:  TOPAMAX TAKE 2 TABLETS IN THE MORNING AND 3 TABLETS AT NIGHTTIME     The medication list was reviewed and reconciled. All changes or newly prescribed medications were explained.  A complete medication list was provided to the patient/caregiver.  Ella Bodo, MD  [facility], Department of Pediatrics  Pediatric Resident PGY-2 Pager: [phone]   30 minutes of face-to-face time was spent with Aliyah.  I performed physical  examination, participated in history taking, and guided decision making.  Deetta Perla MD

## 2016-11-20 NOTE — Assessment & Plan Note (Signed)
BP in clinic today stable, continue to monitor.

## 2016-11-20 NOTE — Assessment & Plan Note (Signed)
Inappropriate use of albuterol inhaler. Discussed to restart Qvar and use everyday. Discussed to use albuterol PRN and to notify if she's requiring it more than 2-3 times weekly. Follow up in 6 months. Will check on her in 1 month.

## 2016-11-20 NOTE — Progress Notes (Signed)
Subjective:    Patient ID: Kathryn [name], female    DOB: 08/21/1996, 21 y.o.   MRN: [id number]  HPI  Ms. Kathryn [name] is a 21 year old female who presents today to establish care and discuss the problems mentioned below. Will obtain old records.  1) Asthma: Diagnosed at birth. Currently managed on Qvar and albuterol inhalers. Kathryn [name] does not use Kathryn [name] Qvar and has not used for the past 6 months. Kathryn [name]'s using Kathryn [name] albuterol inhaler every day, every 6 hours for wheezing and chest tightness. Kathryn [name] has a nebulized machine for which Kathryn [name] uses albuterol nebulized every night at bedtime. Kathryn [name] is needing a refill of Kathryn [name] ProAir inhaler today.  2) Chronic Headaches/Migraines: Present since 2016. Currently managed on Topamax 50 every morning and 75 mg at night. Kathryn [name] also takes Relpax as needed and tizanidine at bedtime. Kathryn [name] follows with neurology and has an appointment later today. Overall Kathryn [name] feels well managed.  3) Prediabetes: A1C of 6.0 in June 2016. Kathryn [name]'s not had Kathryn [name] A1C checked since then. Kathryn [name] endorses a poor diet and does not regularly exercise. Kathryn [name] has noticed numbness/tingling to Kathryn [name] feet occasionally. Kathryn [name] denies dizziness, chest pain.  4) Thyroiditis: Diagnosed with goiter several years ago. TSH in June 2016 normal. Kathryn [name]'s not managed on any medication.  5) GERD: Experiences symptoms of esophageal burning, reflux of acid without Kathryn [name] omeprazole. This has been ongoing for the past 1 year. Kathryn [name]'s tried coming off of Kathryn [name] omeprazole but the symptoms return. Kathryn [name] is needing a refill today.   6) Knee Pain: Present since November 2017. [name] and [facility] diagnosed with inflammation and swelling. Kathryn [name]'s taking Naproxen presently with 1 hour of improvement. Kathryn [name] works on Kathryn [name] feet for 8 hours daily. Kathryn [name] is to be scheduling an appointment with orthopedics to discuss.  Review of Systems  Constitutional: Negative for unexpected weight change.  Eyes: Negative for visual disturbance.  Respiratory: Positive for chest  tightness and wheezing. Negative for shortness of breath.   Cardiovascular: Negative for chest pain.  Gastrointestinal:       Gerd  Genitourinary: Negative for menstrual problem.  Musculoskeletal: Positive for arthralgias. Negative for back pain.  Skin: Negative for color change.  Neurological: Positive for headaches. Negative for dizziness and numbness.  Hematological: Negative for adenopathy.       Past Medical History:  Diagnosis Date  . Acanthosis nigricans   . Asthma   . Dyspepsia   . Goiter   . Headache   . Hypertension   . Isosexual precocity   . Pre-diabetes   . Thyroiditis, autoimmune      Social History   Social History  . Marital status: Single    Spouse name: N/A  . Number of children: N/A  . Years of education: N/A   Occupational History  . Not on file.   Social History Main Topics  . Smoking status: Never Smoker  . Smokeless tobacco: Never Used  . Alcohol use No  . Drug use: No  . Sexual activity: Yes    Birth control/ protection: Condom   Other Topics Concern  . 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   Kathryn [name] has 4 siblings, 24, 24, 8, and 20 yo.    Kathryn [name] enjoys dancing and fishing.   Studying banking and finance.    No past surgical history on file.  Family History  Problem Relation Age of Onset  . Heart disease Paternal  Grandmother   . Hypertension Paternal Grandmother   . Asthma Mother   . Alcohol abuse Father   . Drug abuse Father   . Asthma Sister   . Asthma Brother   . Mental illness Brother   . Cancer Maternal Grandmother   . Hypertension Maternal Grandmother   . COPD Maternal Grandfather   . Depression Maternal Grandfather   . Learning disabilities Maternal Grandfather     No Known Allergies  Current Outpatient Prescriptions on File Prior to Visit  Medication Sig Dispense Refill  . albuterol (PROVENTIL) (2.5 MG/3ML) 0.083% nebulizer solution Take 2.5 mg by nebulization  every 6 (six) hours as needed for wheezing or shortness of breath.    . cetirizine (ZYRTEC) 10 MG tablet Take 10 mg by mouth daily.    . diclofenac (VOLTAREN) 75 MG EC tablet Take one tablet daily. 30 tablet 1  . Multiple Vitamins-Minerals (MULTIVITAMIN GUMMIES ADULTS PO) Take 1 tablet by mouth daily.    Marland Kitchen PATADAY 0.2 % SOLN Place 1 drop into both eyes daily.   6  . RELPAX 40 MG tablet Take one tablet at onset of migraine with 400 mg of ibuprofen may repeat in 2 hours if headache persists or recurs. 10 tablet 5  . topiramate (TOPAMAX) 25 MG tablet TAKE 2 TABLETS IN THE MORNING AND 3 TABLETS AT NIGHTTIME 155 tablet 5   No current facility-administered medications on file prior to visit.     BP 124/86   Pulse 77   Temp 98.2 F (36.8 C) (Oral)   Ht 5\' 9"  (1.753 m)   Wt 255 lb 6.4 oz (115.8 kg)   LMP 11/10/2016   SpO2 97%   BMI 37.72 kg/m    Objective:   Physical Exam  Constitutional: Kathryn [name] is oriented to person, place, and time. Kathryn [name] appears well-nourished.  Neck: Neck supple.  Cardiovascular: Normal rate and regular rhythm.   Pulmonary/Chest: Effort normal and breath sounds normal. Kathryn [name] has no wheezes.  Musculoskeletal:       Right knee: Kathryn [name] exhibits swelling. Kathryn [name] exhibits normal range of motion, no deformity and no bony tenderness.  Neurological: Kathryn [name] is alert and oriented to person, place, and time.  Skin: Skin is warm and dry.  Acanthosis nigricans noted to neck line  Psychiatric: Kathryn [name] has a normal mood and affect.          Assessment & Plan:  Knee Pain:  Diagnosed with Bursitis in November 2017. Pain present since then. Exam today stable. Discussed use of knee brace, importance of weight loss, limit use of OTC analgesics, especially given dyspepsia. Will continue to monitor.  Morrie Sheldon, NP

## 2016-11-20 NOTE — Assessment & Plan Note (Signed)
Following with Neurology, stable on Topamax, Tizanidine, and Relpax.

## 2016-11-20 NOTE — Assessment & Plan Note (Signed)
A1C of 6.0 on today's labs, discussed to start exercising and improve her diet. Repeat in 6 months.

## 2016-12-13 ENCOUNTER — Encounter (INDEPENDENT_AMBULATORY_CARE_PROVIDER_SITE_OTHER): Payer: Self-pay | Admitting: Pediatrics

## 2016-12-13 NOTE — Telephone Encounter (Signed)
Headache calendar from January 2018 on Kathryn [name]. 25 days were recorded.  No days were headache free.  9 days were associated with tension type headaches, 9 required treatment.  There were 16 days of migraines, 5 were severe.  I'm not certain what the next step will be to change her treatment.  I will send My Chart note.

## 2016-12-16 ENCOUNTER — Telehealth: Payer: Self-pay | Admitting: Primary Care

## 2016-12-16 NOTE — Telephone Encounter (Signed)
Spoken to patient. She stated that she is using the Qvar inhaler everyday. With this weather, she is having to use the albuterol inhaler 1 or 2 times a week. Patient is overall doing okay.

## 2016-12-16 NOTE — Telephone Encounter (Signed)
Noted, this is appropriate use.

## 2016-12-16 NOTE — Telephone Encounter (Signed)
-----   Message from Doreene NestKatherine K Caeli Linehan, NP sent at 11/20/2016  8:43 PM EST ----- Regarding: Asthma Is she using her Qvar inhaler everyday? How often is she using her albuterol inhaler?

## 2017-03-26 ENCOUNTER — Encounter (INDEPENDENT_AMBULATORY_CARE_PROVIDER_SITE_OTHER): Payer: Self-pay | Admitting: Family

## 2017-03-26 ENCOUNTER — Ambulatory Visit (INDEPENDENT_AMBULATORY_CARE_PROVIDER_SITE_OTHER): Payer: BLUE CROSS/BLUE SHIELD | Admitting: Family

## 2017-03-26 VITALS — BP 126/70 | HR 84 | Ht 69.0 in | Wt 246.2 lb

## 2017-03-26 DIAGNOSIS — G43009 Migraine without aura, not intractable, without status migrainosus: Secondary | ICD-10-CM

## 2017-03-26 MED ORDER — RELPAX 40 MG PO TABS: ORAL_TABLET | ORAL | 5 refills | Status: DC

## 2017-03-26 MED ORDER — TOPIRAMATE 25 MG PO TABS: ORAL_TABLET | ORAL | 5 refills | Status: DC

## 2017-03-26 MED ORDER — TIZANIDINE HCL 2 MG PO TABS: 2.0000 mg | ORAL_TABLET | Freq: Every day | ORAL | 1 refills | Status: DC

## 2017-03-26 NOTE — Patient Instructions (Addendum)
Continue to take Topiramate 25mg  - 3 tablets in the morning and 3 tablets at night. Remember that this medication requires that you drink plenty of water each day. Be sure to drink at least 6 bottles of water each day, more on days when you are outside or exposed to hot temperatures.   For the next 2 weeks, take Tizanidine 2mg  - 1 tablet at bedtime to see if it will help to reduce your headaches and help you to get better sleep.   I sent in refills on Topiramate, Relpax and Tizanidine to your pharmacy.   Keep a headache diary and send it in monthly. You can send the diary in by MyChart, mail, fax or drop it off at this office.   Please return for follow up in August before you return to school.

## 2017-03-26 NOTE — Progress Notes (Signed)
Patient: Kathryn [name] MRN: [id number] Sex: female DOB: [birth year]  Provider: Elveria Rising, NP Location of Care: [facility]  Note type: Routine return visit  History of Present Illness: Referral Source: Dr. Dahlia Byes History from: patient and [facility] chart Chief Complaint: Migraines  [name] is a 21 y.o. young woman with history of migraine without aura. She was last seen November 20, 2016 by Dr Sharene Skeans. Kathryn [name]'s migraines are frontal to lateral, pounding pain associated with photo/phonophobia and blurry vision. She tells me today that her migraines had improved after her last visit but increased in the last 2 months to almost every other day. She believes that stress is the chief trigger, with insufficient sleep contributing to the headaches. She admits that on most days that she got only 3 or 4 hours of sleep. 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 She said that she is at home for the summer, and will be working 1 job and taking 2 online classes. She said that there are still some family stressors that she has to deal with but that she is working on ways to manage the stress. In addition, she said that when she returns to college in the fall, that she is not going to attempt to do as much, and not be as overextended as she was this past spring. [name] is taking Topiramate for migraine prevention and says that she drinks at least 4 bottles of water each day. She does not skip meals, but admits that she often eats snack foods due to time constraints.  [name] takes Relpax when the migraine is severe but generally tries to "push on" each day because of the frequency of the headaches. She has had some trouble sleeping because of headache pain keeping her awake.   [name] has been otherwise generally healthy and has no other health concerns other than  previously mentioned.  Review of Systems: Please see the HPI for neurologic and other pertinent review of systems. Otherwise, the following systems are noncontributory including constitutional, eyes, ears, nose and throat, cardiovascular, respiratory, gastrointestinal, genitourinary, musculoskeletal, skin, endocrine, hematologic/lymph, allergic/immunologic and psychiatric.   Past Medical History:  Diagnosis Date  . Acanthosis nigricans   . Asthma   . Dyspepsia   . Goiter   . Headache   . Hypertension   . Isosexual precocity   . Pre-diabetes   . Thyroiditis, autoimmune    Hospitalizations: No., Head Injury: No., Nervous System Infections: No., Immunizations up to date: Yes.   Past Medical History Comments: Birth History -Term infant born at [redacted] weeks gestational age to a 20 year old g 1 p 0 female.  Surgical History History reviewed. No pertinent surgical history.  Family History family history includes Alcohol abuse in her father; Asthma in her brother, mother, and sister; COPD in her maternal grandfather; Cancer in her maternal grandmother; Depression in her maternal grandfather; Drug abuse in her father; Heart disease in her paternal grandmother; Hypertension in her maternal grandmother and paternal grandmother; Learning disabilities in her maternal grandfather; Mental illness in her brother. Family History is otherwise negative for migraines, seizures, cognitive impairment, blindness, deafness, birth defects, chromosomal disorder, autism.  Social History Social History   Social History  . Marital status: Single    Spouse name: N/A  . Number of children: N/A  . Years of education: N/A  Social History Main Topics  . Smoking status: Never Smoker  . Smokeless tobacco: Never Used  . Alcohol use No  . Drug use: No  . Sexual activity: Yes    Birth control/ protection: Condom   Other Topics Concern  . 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   She has 4 siblings, 24, 24, 8, and 20 yo.    She enjoys dancing and fishing.   Studying banking and finance.   Allergies No Known Allergies  Physical Exam BP 126/70   Pulse 84   Ht 5\' 9"  (1.753 m)   Wt 246 lb 3.2 oz (111.7 kg)   BMI 36.36 kg/m  General: well developed, well nourished, seated, in no evident distress, black hair, brown eyes, right handed Head: head normocephalic and atraumatic.  Oropharynx benign. Neck: supple with no carotid or supraclavicular bruits Cardiovascular: regular rate and rhythm, no murmurs Skin: No rashes or lesions  Neurologic Exam Mental Status: Awake and fully alert.  Oriented to place and time.  Recent and remote memory intact.  Attention span, concentration, and fund of knowledge appropriate.  Mood and affect appropriate. Cranial Nerves: Fundoscopic exam reveals sharp disc margins.  Pupils equal, briskly reactive to light.  Extraocular movements full without nystagmus.  Visual fields full to confrontation.  Hearing intact and symmetric to finger rub.  Facial sensation intact.  Face tongue, palate move normally and symmetrically.  Neck flexion and extension normal. Motor: Normal bulk and tone. Normal strength in all tested extremity muscles. Sensory: Intact to touch and temperature in all extremities.  Coordination: Rapid alternating movements normal in all extremities.  Finger-to-nose and heel-to shin performed accurately bilaterally.  Romberg negative. Gait and Station: Arises from chair without difficulty.  Stance is normal. Gait demonstrates normal stride length and balance.   Able to heel, toe and tandem walk without difficulty. Reflexes: Diminished and symmetric. Toes downgoing.  Impression  1. Migraine without aura and without status migrainosus, not intractable, G43.009. 2. History of new daily persistent headache, G44.52. 3.  Acanthosis nigricans, L 83. 4.  Class II obesity due to excess calories without serious  comorbidity with BMI of 37.0-37.9, E66.09, Z68.37.  Recommendations for plan of care [facility]. Kathryn [name] has neither had nor required imaging or lab studies since the last visit. She is a 21 year old woman with history of migraine without aura. She is taking and tolerating Topiramate for migraine prevention. She has had increase in migraines but admits that it is likely from lifestyle. She admits to considerable stress at the end of her college semester, poor diet and not getting much sleep. She is at home for summer and is hopeful that even with working and taking online classes that her headaches will improve due to reduction in stress. We talked about the need for her to take better care of herself and I reminded Yer that skipping meals, not drinking enough water, insufficient sleep and not managing stress will all trigger headaches. I asked her to work on these things and keep headache diaries. If her headaches do not improve, we may need to address her headache prevention medication but I would prefer that she work on lifestyle measures. She has had trouble sleeping and I recommended that she take Tizanidine at bedtime for 2 weeks to see if that will help to reduce headaches and get her sleep schedule back on track. I will otherwise see her  back in follow up in August before she returns back to school.  Viona agreed with the plans made today.   The medication list was reviewed and reconciled.  No changes were made in the prescribed medications today.  A complete medication list was provided to the patient.   Allergies as of 03/26/2017   No Known Allergies     Medication List       Accurate as of 03/26/17 11:59 PM. Always use your most recent med list.          albuterol (2.5 MG/3ML) 0.083% nebulizer solution Commonly known as:  PROVENTIL Take 2.5 mg by nebulization every 6 (six) hours as needed for wheezing or shortness of breath.   PROAIR HFA 108  (90 Base) MCG/ACT inhaler Generic drug:  albuterol Inhale 2 puffs into the lungs every 6 (six) hours as needed for wheezing or shortness of breath.   beclomethasone 80 MCG/ACT inhaler Commonly known as:  QVAR Inhale 2 puffs into the lungs 2 (two) times daily.   cetirizine 10 MG tablet Commonly known as:  ZYRTEC Take 10 mg by mouth daily.   diclofenac 75 MG EC tablet Commonly known as:  VOLTAREN Take one tablet daily.   MULTIVITAMIN GUMMIES ADULTS PO Take 1 tablet by mouth daily.   omeprazole 10 MG capsule Commonly known as:  PRILOSEC Take 1 capsule (10 mg total) by mouth daily.   PATADAY 0.2 % Soln Generic drug:  Olopatadine HCl Place 1 drop into both eyes daily.   RELPAX 40 MG tablet Generic drug:  eletriptan Take one tablet at onset of migraine with 400 mg of ibuprofen may repeat in 2 hours if headache persists or recurs.   tiZANidine 2 MG tablet Commonly known as:  ZANAFLEX Take 1 tablet (2 mg total) by mouth daily.   topiramate 25 MG tablet Commonly known as:  TOPAMAX TAKE 3 TABLETS IN THE MORNING AND 3 TABLETS AT NIGHTTIME       Dr. Sharene Skeans was consulted regarding the patient.   Total time spent with the patient was 25 minutes, of which 50% or more was spent in counseling and coordination of care.   Elveria Rising NP-C

## 2017-05-20 ENCOUNTER — Encounter: Payer: Self-pay | Admitting: Primary Care

## 2017-05-20 ENCOUNTER — Ambulatory Visit (INDEPENDENT_AMBULATORY_CARE_PROVIDER_SITE_OTHER): Payer: BLUE CROSS/BLUE SHIELD | Admitting: Primary Care

## 2017-05-20 VITALS — BP 120/76 | HR 71 | Temp 98.2°F | Ht 69.0 in | Wt 250.1 lb

## 2017-05-20 DIAGNOSIS — R7303 Prediabetes: Secondary | ICD-10-CM

## 2017-05-20 DIAGNOSIS — R1013 Epigastric pain: Secondary | ICD-10-CM | POA: Diagnosis not present

## 2017-05-20 DIAGNOSIS — I10 Essential (primary) hypertension: Secondary | ICD-10-CM | POA: Diagnosis not present

## 2017-05-20 DIAGNOSIS — J4540 Moderate persistent asthma, uncomplicated: Secondary | ICD-10-CM | POA: Diagnosis not present

## 2017-05-20 DIAGNOSIS — G43009 Migraine without aura, not intractable, without status migrainosus: Secondary | ICD-10-CM

## 2017-05-20 DIAGNOSIS — K219 Gastro-esophageal reflux disease without esophagitis: Secondary | ICD-10-CM

## 2017-05-20 LAB — HEMOGLOBIN A1C: HEMOGLOBIN A1C: 5.9 % (ref 4.6–6.5)

## 2017-05-20 MED ORDER — BECLOMETHASONE DIPROPIONATE 80 MCG/ACT IN AERS: 2.0000 | INHALATION_SPRAY | Freq: Two times a day (BID) | RESPIRATORY_TRACT | 5 refills | Status: DC

## 2017-05-20 MED ORDER — OMEPRAZOLE 10 MG PO CPDR: 10.0000 mg | DELAYED_RELEASE_CAPSULE | Freq: Every day | ORAL | 1 refills | Status: DC

## 2017-05-20 MED ORDER — PROAIR HFA 108 (90 BASE) MCG/ACT IN AERS: 2.0000 | INHALATION_SPRAY | Freq: Four times a day (QID) | RESPIRATORY_TRACT | 5 refills | Status: DC | PRN

## 2017-05-20 MED ORDER — ALBUTEROL SULFATE (2.5 MG/3ML) 0.083% IN NEBU: 2.5000 mg | INHALATION_SOLUTION | Freq: Four times a day (QID) | RESPIRATORY_TRACT | 1 refills | Status: DC | PRN

## 2017-05-20 NOTE — Progress Notes (Signed)
Subjective:    Patient ID: Timothy Lasso, female    DOB: [birth year], 21 y.o.   MRN: [id number]  HPI  Ms. Eanes is a 21 year old female who presents today for follow up.  1) Asthma: Currently managed on Qvar and albuterol. During her visit in January she wasn't using Qvar and was using albuterol daily. Since her last visit she's does experience intermittent wheezing. She is compliant to her Qvar daily, using albuterol nebulized treatments once weekly on average. She accidentally left her Qvar at home while at the beach for 1 week and noticed increased wheezing and shortness of breath.  2) GERD: Currently managed on omeprazole 10 mg daily for which she takes after eating. She does experience esophageal and throat burning several times daily.   3) Prediabetes: A1C of 6.0 in January 2018. During her last visit she was encouraged to work on weight loss through healthy diet and exercise. There was evidence of acanthosis nigricans. She is now working on improvements in diet by eating smaller portion sizes. She has tried to start exercising at the gym after work. She denies numbness/tingling, weakness, fatigue.  4) Migraines: Currently managed on Topamax, Tizanidine, Relpax and following with Neurology. She visited her Neurologist in May 2018 who increased doses of her medications. She is due for follow up next month.  Review of Systems  Constitutional: Negative for fatigue.  Respiratory: Negative for shortness of breath and wheezing.   Cardiovascular: Negative for chest pain.  Gastrointestinal:       GERD  Neurological: Negative for numbness.       Chronic headaches       Past Medical History:  Diagnosis Date  . Acanthosis nigricans   . Asthma   . Dyspepsia   . Goiter   . Headache   . Hypertension   . Isosexual precocity   . Pre-diabetes   . Thyroiditis, autoimmune      Social History   Social History  . Marital status: Single    Spouse name: N/A  . Number of children: N/A  .  Years of education: N/A   Occupational History  . Not on file.   Social History Main Topics  . Smoking status: Never Smoker  . Smokeless tobacco: Never Used  . Alcohol use No  . Drug use: No  . Sexual activity: Yes    Birth control/ protection: Condom   Other Topics Concern  . 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    She enjoys dancing and fishing.   Studying banking and finance.    No past surgical history on file.  Family History  Problem Relation Age of Onset  . Heart disease Paternal Grandmother   . Hypertension Paternal Grandmother   . Asthma Mother   . Alcohol abuse Father   . Drug abuse Father   . Asthma Sister   . Asthma Brother   . Mental illness Brother   . Cancer Maternal Grandmother   . Hypertension Maternal Grandmother   . COPD Maternal Grandfather   . Depression Maternal Grandfather   . Learning disabilities Maternal Grandfather     No Known Allergies  Current Outpatient Prescriptions on File Prior to Visit  Medication Sig Dispense Refill  . cetirizine (ZYRTEC) 10 MG tablet Take 10 mg by mouth daily.    . diclofenac (VOLTAREN) 75 MG EC tablet  Take one tablet daily. 30 tablet 1  . Multiple Vitamins-Minerals (MULTIVITAMIN GUMMIES ADULTS PO) Take 1 tablet by mouth daily.    Marland Kitchen. PATADAY 0.2 % SOLN Place 1 drop into both eyes daily.   6  . RELPAX 40 MG tablet Take one tablet at onset of migraine with 400 mg of ibuprofen may repeat in 2 hours if headache persists or recurs. 10 tablet 5  . tiZANidine (ZANAFLEX) 2 MG tablet Take 1 tablet (2 mg total) by mouth daily. 30 tablet 1  . topiramate (TOPAMAX) 25 MG tablet TAKE 3 TABLETS IN THE MORNING AND 3 TABLETS AT NIGHTTIME 186 tablet 5   No current facility-administered medications on file prior to visit.     BP 120/76   Pulse 71   Temp 98.2 F (36.8 C) (Oral)   Ht 5\' 9"  (1.753 m)   Wt 250 lb 1.9 oz (113.5  kg)   LMP 05/10/2017   SpO2 96%   BMI 36.94 kg/m    Objective:   Physical Exam  Constitutional: She appears well-nourished.  Neck: Neck supple.  Cardiovascular: Normal rate and regular rhythm.   Pulmonary/Chest: Effort normal and breath sounds normal. She has no wheezes. She has no rales.  Skin: Skin is warm and dry.  Psychiatric: She has a normal mood and affect.          Assessment & Plan:

## 2017-05-20 NOTE — Patient Instructions (Signed)
Start taking omeprazole 10 mg capsules every morning when you wake. Please message me on My Chart if you continue to experience symptoms in 3 weeks.  Complete lab work prior to leaving today. I will notify you of your results once received.   Use the Qvar everyday. Use the albuterol only as needed. Please message me if you start requiring use of the albuterol more than three times weekly.  Schedule your physical for 6 months from now.  It was a pleasure to see you today!    Food Choices for Gastroesophageal Reflux Disease, Adult When you have gastroesophageal reflux disease (GERD), the foods you eat and your eating habits are very important. Choosing the right foods can help ease your discomfort. What guidelines do I need to follow?  Choose fruits, vegetables, whole grains, and low-fat dairy products.  Choose low-fat meat, fish, and poultry.  Limit fats such as oils, salad dressings, butter, nuts, and avocado.  Keep a food diary. This helps you identify foods that cause symptoms.  Avoid foods that cause symptoms. These may be different for everyone.  Eat small meals often instead of 3 large meals a day.  Eat your meals slowly, in a place where you are relaxed.  Limit fried foods.  Cook foods using methods other than frying.  Avoid drinking alcohol.  Avoid drinking large amounts of liquids with your meals.  Avoid bending over or lying down until 2-3 hours after eating. What foods are not recommended? These are some foods and drinks that may make your symptoms worse: Vegetables Tomatoes. Tomato juice. Tomato and spaghetti sauce. Chili peppers. Onion and garlic. Horseradish. Fruits Oranges, grapefruit, and lemon (fruit and juice). Meats High-fat meats, fish, and poultry. This includes hot dogs, ribs, ham, sausage, salami, and bacon. Dairy Whole milk and chocolate milk. Sour cream. Cream. Butter. Ice cream. Cream cheese. Drinks Coffee and tea. Bubbly (carbonated) drinks  or energy drinks. Condiments Hot sauce. Barbecue sauce. Sweets/Desserts Chocolate and cocoa. Donuts. Peppermint and spearmint. Fats and Oils High-fat foods. This includes JamaicaFrench fries and potato chips. Other Vinegar. Strong spices. This includes black pepper, white pepper, red pepper, cayenne, curry powder, cloves, ginger, and chili powder. The items listed above may not be a complete list of foods and drinks to avoid. Contact your dietitian for more information. This information is not intended to replace advice given to you by your health care provider. Make sure you discuss any questions you have with your health care provider. Document Released: 04/28/2012 Document Revised: 04/04/2016 Document Reviewed: 09/01/2013 Elsevier Interactive Patient Education  2017 [employer]

## 2017-05-20 NOTE — Assessment & Plan Note (Signed)
A1C pending today. Discussed the importance of a healthy diet and regular exercise in order for weight loss, and to reduce the risk of other medical problems.

## 2017-05-20 NOTE — Assessment & Plan Note (Signed)
BP stable today. Continue to monitor.

## 2017-05-20 NOTE — Assessment & Plan Note (Signed)
Improved overall on daily Qvar. Using albuterol once weekly during Summer months. Refills provided today. Continue to monitor.

## 2017-05-20 NOTE — Assessment & Plan Note (Addendum)
Symptoms occur several times daily. Discussed to start omeprazole every morning when waking. Consider increasing dose to 20 mg if no improvement. She will update.

## 2017-05-20 NOTE — Assessment & Plan Note (Signed)
Following with neurology. Feels well managed, due for follow up in August.

## 2017-06-06 ENCOUNTER — Encounter: Payer: BLUE CROSS/BLUE SHIELD | Attending: Primary Care | Admitting: Dietician

## 2017-06-06 ENCOUNTER — Encounter: Payer: Self-pay | Admitting: Dietician

## 2017-06-06 VITALS — Ht 69.0 in | Wt 255.0 lb

## 2017-06-06 DIAGNOSIS — IMO0001 Reserved for inherently not codable concepts without codable children: Secondary | ICD-10-CM

## 2017-06-06 DIAGNOSIS — R7303 Prediabetes: Secondary | ICD-10-CM

## 2017-06-06 NOTE — Patient Instructions (Addendum)
-   Choose 100% Juice when you do choose to drink juice, and remember 8oz is a standard serving size for most juice drinks.  - Cut down on or eliminate processed pork consumption. Lean cuts of pork such as the loin are fine choices however!  - Choose whole grain bread or other grains (100%) when choosing to eat these types of foods.  - Practice eating more vegetables and less starch at meal times. If eating potatoes, eat the skin too!  - When school starts, aim to work up to exercising 3 times/week, with a goal of at least 30 minutes, ideally 60. 150 minutes per week is recommended.

## 2017-06-06 NOTE — Progress Notes (Signed)
Medical Nutrition Therapy: Visit start time: 1315  end time: 1400  Assessment:  Diagnosis: prediabetes Past medical history: HTN, obesity, thyroiditis, see chart Psychosocial issues/ stress concerns: occasional depression reported Preferred learning method:  . Hands-on  Current weight: 255lb  Height: 5\' 9"  Medications, supplements: MVI, Topamax, Zanaflex, Omeprazole, see chart for full list  Progress and evaluation: Patient's main goal is to become educated on ways to prevent diabetes. HgbA1c 5.9 (05/20/17). Reports eating out frequently, particularly at lunch, due to job/ school schedule. Has not been going to the gym to exercise as often d/t summer school starting. Occasionally packs a [employer] frozen meal for lunch at work rather than eating out. Either she or her mother prepare dinners at home; occasionally they will eat out. Cooks with both olive oil and butter at home. Reports high intake of carbohydrate-heavy foods such as pasta, and breads. Likes most fruits and vegetables. Per paper assessment, goat wt is 190-200#. She has the MyFitnessPal app on her smart phone and states she would like to start tracking calories again.    Physical activity: dance; [employer] (cardio and weight lifting) for 1hr each session, 1-2x/wk.. Was going to the gym 4x/wk until school/work schedule changed.  Dietary Intake:  Usual eating pattern includes 2-3 meals and 1-2 snacks per day. Dining out frequency: 13 meals per week.  Breakfast: not always. Atkins protein shake or oatmeal Snack: n/a Lunch: options close to job: chick fil a (soup/salad/chicken sandwich) Snack: n/a Supper: fried chicken, chinese food, burger, spaghetti, mixed veggies, grilled chicken Snack: chips, pistaccios Beverages: juice, water, sodas as of recently, coffee w/ cream  Nutrition Care Education: Topics covered: parameters of "healthy" weight loss, role of exercise in weight reduction, building a balanced meal, choosing  lower fat dairy/ meats/ dressings and condiments, goal for percent of total calories coming from fat, foods that contain fiber, sugar sweetened beverages, portions for different food groups, looking in the frozen section of the grocery store for different vegetables/ vegetable + grain combinations/ veggie pastas Basic nutrition: basic food groups, appropriate nutrient balance, appropriate meal and snack schedule, general nutrition guidelines    Weight control: benefits of weight control, identifying healthy weight, determining reasonable weight goal, behavioral changes for weight loss Advanced nutrition: cooking techniques, dining out, food label reading Hyperlipidemia: healthy and unhealthy fats, role of fiber Other lifestyle changes:  benefits of making changes, increasing motivation, readiness for change, identifying habits that need to change   Nutritional Diagnosis:  [place]-3.3 Overweight/obesity As related to lifestyle habits.  As evidenced by BMI 37.65.  Intervention: Discussion as noted above. Patient came up with 5 goals to work on over the next 4 months, which were provided to her on AVS. Personalized daily calorie goal was provided for her to plug in to the MyFitnessPal app. Wt loss goal of 5-10% of CBW (13-25#) in 6 months to prevent diabetes.  Education Materials given:  . Healthy Habits to Prevent Diabetes sheet . Your Game Plan to Prevent Type II Diabetes handout . Goals/ instructions  Learner/ who was taught:  . Patient  Level of understanding: . Partial understanding; needs review/ practice  Demonstrated degree of understanding via:   Teach back Learning barriers: . None  Willingness to learn/ readiness for change: . Acceptance, ready for change  Monitoring and Evaluation:  Dietary intake, exercise, and body weight      follow up: in 4 month(s)

## 2017-06-11 ENCOUNTER — Ambulatory Visit (INDEPENDENT_AMBULATORY_CARE_PROVIDER_SITE_OTHER): Payer: BLUE CROSS/BLUE SHIELD | Admitting: Family

## 2017-06-17 ENCOUNTER — Encounter (INDEPENDENT_AMBULATORY_CARE_PROVIDER_SITE_OTHER): Payer: Self-pay | Admitting: Family

## 2017-06-17 ENCOUNTER — Ambulatory Visit (INDEPENDENT_AMBULATORY_CARE_PROVIDER_SITE_OTHER): Payer: BLUE CROSS/BLUE SHIELD | Admitting: Family

## 2017-06-17 VITALS — BP 120/70 | HR 80 | Ht 69.0 in | Wt 253.6 lb

## 2017-06-17 DIAGNOSIS — L83 Acanthosis nigricans: Secondary | ICD-10-CM

## 2017-06-17 DIAGNOSIS — G43009 Migraine without aura, not intractable, without status migrainosus: Secondary | ICD-10-CM | POA: Diagnosis not present

## 2017-06-17 DIAGNOSIS — E6609 Other obesity due to excess calories: Secondary | ICD-10-CM | POA: Diagnosis not present

## 2017-06-17 DIAGNOSIS — Z6837 Body mass index (BMI) 37.0-37.9, adult: Secondary | ICD-10-CM | POA: Diagnosis not present

## 2017-06-17 NOTE — Progress Notes (Signed)
Patient: Kathryn [name] MRN: [id number] Sex: female DOB: 1996/10/19  Provider: Elveria Rising, NP Location of Care: [facility]  Note type: Routine return visit  History of Present Illness: Referral Source: Dr. Dahlia Byes History from: patient and [facility] chart Chief Complaint: Migraines  Kathryn [name] is a 21 y.o. young woman with history of migraine without aura. She was last seen Mar 26, 2017. Kathryn [name]'s migraines are frontal to lateral, pounding pain associated with photo/phonophobia and blurry vision. When she was last seen, her migraines were occurring almost every other day for about 2 months. She attributed stress as the chief trigger, with insufficient sleep contributing to the headaches. She admitted that on most days that she got only 3 or 4 hours of sleep. 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 She tells me today that since her last visit, her migraines have improved, and that she has tension headaches a couple of times per week. Some of these tension headaches can require treatment. She has been working on stress reduction and is looking forward to returning to college next week.   Kathryn [name] is taking Topiramate for migraine prevention and says that she drinks at least 4 bottles of water each day. She does not skip meals, but admits that she often eats snack foods due to time constraints.  Kathryn [name] takes Relpax when the migraine is severe and takes Tylenol for tension headaches when they are problematic.   Kathryn [name] sees an endocrinologist for prediabetes and says that her condition is stable at this time. She has been otherwise generally healthy and has no other health concerns other than previously mentioned.  Review of Systems: Please see the HPI for neurologic and other pertinent review of systems. Otherwise, the following systems are  noncontributory including constitutional, eyes, ears, nose and throat, cardiovascular, respiratory, gastrointestinal, genitourinary, musculoskeletal, skin, endocrine, hematologic/lymph, allergic/immunologic and psychiatric.   Past Medical History:  Diagnosis Date  . Acanthosis nigricans   . Asthma   . Dyspepsia   . Goiter   . Headache   . Hypertension   . Isosexual precocity   . Pre-diabetes   . Thyroiditis, autoimmune    Hospitalizations: No., Head Injury: No., Nervous System Infections: No., Immunizations up to date: Yes.   Past Medical History Comments: see history Birth History -Term infant born at [redacted] weeks gestational age to a 20 year old g 1 p 0 female.  Surgical History History reviewed. No pertinent surgical history.  Family History family history includes Alcohol abuse in her father; Asthma in her brother, mother, and sister; COPD in her maternal grandfather; Cancer in her maternal grandmother; Depression in her maternal grandfather; Drug abuse in her father; Heart disease in her paternal grandmother; Hypertension in her maternal grandmother and paternal grandmother; Learning disabilities in her maternal grandfather; Mental illness in her brother. Family History is otherwise negative for migraines, seizures, cognitive impairment, blindness, deafness, birth defects, chromosomal disorder, autism.  Social History Social History   Social History  . Marital status: Single    Spouse name: N/A  . Number of children: N/A  . Years of education: N/A   Social History Main Topics  . Smoking status: Never Smoker  . Smokeless tobacco: Never Used  . Alcohol use No  . Drug use: No  . Sexual activity: Yes    Birth control/ protection: Condom   Other Topics  Concern  . 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   She has 4 siblings, 24, 24, 8, and 65 yo.    She enjoys dancing and fishing.   Studying banking and finance.    Allergies No Known Allergies  Physical Exam BP 120/70   Pulse 80   Ht 5\' 9"  (1.753 m)   Wt 253 lb 9.6 oz (115 kg)   BMI 37.45 kg/m  General: well developed, well nourished young woman, seated on exam table, in no evident distress, black hair, brown eyes, right handed Head: head normocephalic and atraumatic.  Oropharynx benign. Neck: supple with no carotid or supraclavicular bruits Cardiovascular: regular rate and rhythm, no murmurs Skin: No rashes or lesions  Neurologic Exam Mental Status: Awake and fully alert.  Oriented to place and time.  Recent and remote memory intact.  Attention span, concentration, and fund of knowledge appropriate.  Mood and affect appropriate. Cranial Nerves: Fundoscopic exam reveals sharp disc margins.  Pupils equal, briskly reactive to light.  Extraocular movements full without nystagmus.  Visual fields full to confrontation.  Hearing intact and symmetric to finger rub.  Facial sensation intact.  Face tongue, palate move normally and symmetrically.  Neck flexion and extension normal. Motor: Normal bulk and tone. Normal strength in all tested extremity muscles. Sensory: Intact to touch and temperature in all extremities.  Coordination: Rapid alternating movements normal in all extremities.  Finger-to-nose and heel-to shin performed accurately bilaterally.  Romberg negative. Gait and Station: Arises from chair without difficulty.  Stance is normal. Gait demonstrates normal stride length and balance.   Able to heel, toe and tandem walk without difficulty. Reflexes: Diminished and symmetric. Toes downgoing.  Impression 1.  Migraine without aura and without status migrainosus, not intractable, G43.009. 2.  History of new daily persistent headache, G44.52. 3. Acanthosis nigricans, L 83. 4. Class II obesity due to excess calories without serious comorbidity with BMI of 37.0-37.9, E66.09, Z68.37.  Recommendations for plan of care The patient's previous [facility]  records were reviewed. Hayslee has neither had nor required imaging or lab studies since the last visit.  She is a 21 year old woman with history of migraine without aura. She is taking and tolerating Topiramate for migraine prevention and has had improvement in her migraines over the summer. She is satisfied in her current migraine treatment and does not want to make changes at this time. I reminded her to drink at least 48-60 oz of water each day, to get at least 8-9 hours of sleep each night and to avoid skipping meals. I encouraged her to continue to work on weight loss and to follow up with her endocrinologist as scheduled. I will see her back in follow up in December when she is at home for Winter Break. Amai agreed with the plans made today.   Wt Readings from Last 3 Encounters:  06/17/17 253 lb 9.6 oz (115 kg)  06/06/17 255 lb (115.7 kg)  05/20/17 250 lb 1.9 oz (113.5 kg)   The medication list was reviewed and reconciled.  No changes were made in the prescribed medications today.  A complete medication list was provided to the patient.   Allergies as of 06/17/2017   No Known Allergies     Medication List       Accurate as of 06/17/17 11:59 PM. Always use your most recent med list.          albuterol (2.5 MG/3ML)  0.083% nebulizer solution Commonly known as:  PROVENTIL Take 3 mLs (2.5 mg total) by nebulization every 6 (six) hours as needed for wheezing or shortness of breath.   PROAIR HFA 108 (90 Base) MCG/ACT inhaler Generic drug:  albuterol Inhale 2 puffs into the lungs every 6 (six) hours as needed for wheezing or shortness of breath.   beclomethasone 80 MCG/ACT inhaler Commonly known as:  QVAR Inhale 2 puffs into the lungs 2 (two) times daily.   cetirizine 10 MG tablet Commonly known as:  ZYRTEC Take 10 mg by mouth daily.   diclofenac 75 MG EC tablet Commonly known as:  VOLTAREN Take one tablet daily.   MULTIVITAMIN GUMMIES ADULTS PO Take 1 tablet by mouth daily.    omeprazole 10 MG capsule Commonly known as:  PRILOSEC Take 1 capsule (10 mg total) by mouth daily.   PATADAY 0.2 % Soln Generic drug:  Olopatadine HCl Place 1 drop into both eyes daily.   RELPAX 40 MG tablet Generic drug:  eletriptan Take one tablet at onset of migraine with 400 mg of ibuprofen may repeat in 2 hours if headache persists or recurs.   tiZANidine 2 MG tablet Commonly known as:  ZANAFLEX Take 1 tablet (2 mg total) by mouth daily.   topiramate 25 MG tablet Commonly known as:  TOPAMAX TAKE 3 TABLETS IN THE MORNING AND 3 TABLETS AT NIGHTTIME      Total time spent with the patient was 20 minutes, of which 50% or more was spent in counseling and coordination of care.   Elveria Risingina Carneshia Raker NP-C

## 2017-06-19 MED ORDER — TOPIRAMATE 25 MG PO TABS: ORAL_TABLET | ORAL | 5 refills | Status: AC

## 2017-06-19 NOTE — Patient Instructions (Addendum)
Continue taking your Topiramate 3 tablets in the morning and 3 tablets at night. Let me know if your headaches become more frequent or more severe.   Remember that you need to be drinking at least 48-60 oz of water each day, that you need to be getting at least 8-9 hours of sleep each night and that you should not skip meals.   Please plan to return for follow up in December when you are at home for break.

## 2017-07-28 ENCOUNTER — Ambulatory Visit (INDEPENDENT_AMBULATORY_CARE_PROVIDER_SITE_OTHER): Payer: BLUE CROSS/BLUE SHIELD | Admitting: Primary Care

## 2017-07-28 ENCOUNTER — Encounter: Payer: Self-pay | Admitting: Primary Care

## 2017-07-28 ENCOUNTER — Encounter (INDEPENDENT_AMBULATORY_CARE_PROVIDER_SITE_OTHER): Payer: Self-pay | Admitting: Family

## 2017-07-28 VITALS — BP 112/70 | HR 85 | Temp 98.0°F | Ht 69.0 in | Wt 257.8 lb

## 2017-07-28 DIAGNOSIS — R2233 Localized swelling, mass and lump, upper limb, bilateral: Secondary | ICD-10-CM

## 2017-07-28 NOTE — Patient Instructions (Signed)
Stop by the front desk and speak with either Marion or Robin regarding your ultrasound.  It was a pleasure to see you today!   

## 2017-07-28 NOTE — Progress Notes (Signed)
Subjective:    Patient ID: Kathryn [name], female    DOB: 04-03-1996, 21 y.o.   MRN: [id number]  HPI  Kathryn [name] is a 21 year old female who presents today with a chief complaint of skin mass. The mass is located to the left axilla. She has noticed these masses intermittently with menstrual cycles for the past 1 year. Her current mass has been present for the past 2 weeks, she is not due for her menstrual cycle for another 2 weeks. She denies drainage, redness, fevers. She's applied warm compresses and taken Aspirin without improvement. She denies breast masses, breast tenderness, changes in skin texture.   Review of Systems  Constitutional: Negative for fever.  Skin: Negative for color change and rash.       Skin masses       Past Medical History:  Diagnosis Date  . Acanthosis nigricans   . Asthma   . Dyspepsia   . Goiter   . Headache   . Hypertension   . Isosexual precocity   . Pre-diabetes   . Thyroiditis, autoimmune      Social History   Social History  . Marital status: Single    Spouse name: N/A  . Number of children: N/A  . Years of education: N/A   Occupational History  . Not on file.   Social History Main Topics  . Smoking status: Never Smoker  . Smokeless tobacco: Never Used  . Alcohol use No  . Drug use: No  . Sexual activity: Yes    Birth control/ protection: Condom   Other Topics Concern  . 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    She enjoys dancing and fishing.   Studying banking and finance.    No past surgical history on file.  Family History  Problem Relation Age of Onset  . Heart disease Paternal Grandmother   . Hypertension Paternal Grandmother   . Asthma Mother   . Alcohol abuse Father   . Drug abuse Father   . Asthma Sister   . Asthma Brother   . Mental illness Brother   . Cancer Maternal Grandmother   . Hypertension Maternal  Grandmother   . COPD Maternal Grandfather   . Depression Maternal Grandfather   . Learning disabilities Maternal Grandfather     No Known Allergies  Current Outpatient Prescriptions on File Prior to Visit  Medication Sig Dispense Refill  . albuterol (PROVENTIL) (2.5 MG/3ML) 0.083% nebulizer solution Take 3 mLs (2.5 mg total) by nebulization every 6 (six) hours as needed for wheezing or shortness of breath. 75 mL 1  . beclomethasone (QVAR) 80 MCG/ACT inhaler Inhale 2 puffs into the lungs 2 (two) times daily. 1 Inhaler 5  . cetirizine (ZYRTEC) 10 MG tablet Take 10 mg by mouth daily.    . Multiple Vitamins-Minerals (MULTIVITAMIN GUMMIES ADULTS PO) Take 1 tablet by mouth daily.    Marland Kitchen omeprazole (PRILOSEC) 10 MG capsule Take 1 capsule (10 mg total) by mouth daily. 90 capsule 1  . PATADAY 0.2 % SOLN Place 1 drop into both eyes daily.   6  . PROAIR HFA 108 (90 Base) MCG/ACT inhaler Inhale 2 puffs into the lungs every 6 (six) hours as needed for wheezing or shortness of breath. 1 Inhaler 5  . RELPAX 40 MG tablet Take one tablet at onset of migraine with  400 mg of ibuprofen may repeat in 2 hours if headache persists or recurs. 10 tablet 5  . tiZANidine (ZANAFLEX) 2 MG tablet Take 1 tablet (2 mg total) by mouth daily. 30 tablet 1  . topiramate (TOPAMAX) 25 MG tablet TAKE 3 TABLETS IN THE MORNING AND 3 TABLETS AT NIGHTTIME 186 tablet 5  . diclofenac (VOLTAREN) 75 MG EC tablet Take one tablet daily. (Patient not taking: Reported on 06/17/2017) 30 tablet 1   No current facility-administered medications on file prior to visit.     BP 112/70   Pulse 85   Temp 98 F (36.7 C) (Oral)   Ht  (1.753 m)   Wt 257 lb 12.8 oz (116.9 kg)   LMP 07/09/2017   SpO2 96%   BMI 38.07 kg/m    Objective:   Physical Exam  Constitutional: She appears well-nourished.  Neck: Neck supple.  Cardiovascular: Normal rate and regular rhythm.   Pulmonary/Chest: Effort normal and breath sounds normal.  Skin: Skin is  warm and dry. No erythema.  1 cm circular, tender, superficial, immobile, firm, flesh colored mass to left axilla at 10 o'clock spot. Also with smaller mass with same characteristics to right axilla at 1 o'clock spot.          Assessment & Plan:  Axillary Masess:  Intermittent for 1 year, recently to left axilla. Exam today with questionable etiology,  lymph node vs cyst. Both can wax and wane. Will obtain ultrasound of masses for further evaluation. [place] order placed. Will await results.  Morrie Sheldon, NP

## 2017-07-28 NOTE — Addendum Note (Signed)
Addended by: Tawnya Crook on: 07/28/2017 03:22 PM   Modules accepted: Orders

## 2017-08-01 ENCOUNTER — Ambulatory Visit
Admission: RE | Admit: 2017-08-01 | Discharge: 2017-08-01 | Disposition: A | Discharge status: Home / Self Care | Payer: BLUE CROSS/BLUE SHIELD | Source: Ambulatory Visit | Attending: Primary Care | Admitting: Primary Care

## 2017-08-01 DIAGNOSIS — R2233 Localized swelling, mass and lump, upper limb, bilateral: Secondary | ICD-10-CM | POA: Diagnosis not present

## 2017-08-01 DIAGNOSIS — L723 Sebaceous cyst: Secondary | ICD-10-CM | POA: Diagnosis not present

## 2017-09-12 ENCOUNTER — Telehealth: Payer: Self-pay | Admitting: Dietician

## 2017-09-12 NOTE — Telephone Encounter (Signed)
Called patient in order to reschedule follow up appointment d/t office being closed on that date. Mailbox was full, will call back another time.

## 2017-10-03 ENCOUNTER — Ambulatory Visit: Payer: BLUE CROSS/BLUE SHIELD | Admitting: Dietician

## 2017-10-25 ENCOUNTER — Encounter: Payer: Self-pay | Admitting: Primary Care

## 2017-10-25 DIAGNOSIS — R2230 Localized swelling, mass and lump, unspecified upper limb: Secondary | ICD-10-CM

## 2017-10-25 DIAGNOSIS — R239 Unspecified skin changes: Secondary | ICD-10-CM

## 2017-10-29 ENCOUNTER — Encounter: Payer: BLUE CROSS/BLUE SHIELD | Attending: Primary Care | Admitting: Dietician

## 2017-10-29 ENCOUNTER — Encounter: Payer: Self-pay | Admitting: Dietician

## 2017-10-29 VITALS — Wt 261.7 lb

## 2017-10-29 DIAGNOSIS — R7303 Prediabetes: Secondary | ICD-10-CM | POA: Diagnosis not present

## 2017-10-29 DIAGNOSIS — Z6838 Body mass index (BMI) 38.0-38.9, adult: Secondary | ICD-10-CM

## 2017-10-29 DIAGNOSIS — E6609 Other obesity due to excess calories: Secondary | ICD-10-CM

## 2017-10-29 NOTE — Patient Instructions (Addendum)
   Continue to work on the "plate ratio" we discussed: 1/4 plate protein, 1/4 plate starch, 1/2 plate veggies  Plan to re-start going to [employer] within the next couple of weeks Remember, aim for an ultimate goal of 150 min/ week for diabetes prevention  Eat more meals at home than "out" at restaurants or fast food places. If eating out, choose grilled over fried, sides such as salads or fruit rather than french fries, or smaller portions

## 2017-10-29 NOTE — Progress Notes (Signed)
Medical Nutrition Therapy: Visit start time: 1400  end time: 1445 Assessment:  Diagnosis: prediabetes Medical history changes: none Psychosocial issues/ stress concerns: none  Current weight: 261.7lb *with shoes  Height: 5\' 9"  Medications, supplement changes: states she was taking a steroid for a short period of time, unable to recall medication name  Progress and evaluation: Patient with wt gain of 6.7# x 4.5 months. Feels that some weight gain may be attributed to being put on a steroid medication temporarily, increasing her appetite. She reports starting to cook more (about 1/2 the time) rather than eating out once school started, but trended more towards eating out by the end of the semester. Ate out every day during finals week and has eaten out 3 out of 5 days so far this week. Interventions since initial session include: cutting out red meats and using brown rice on occasion. Feels that she may be replacing red meats with larger portions of starches however. Reports roughly 1/2 of her plate to be starches at meal times. States that her mother doesn't yet understand the need for her to change her diet and does not usually accommodate to her meal requests. She plans to start going to [employer] again now that she is on break and because she is already paying for the membership. Also plans to re-start tracking calories / portions in the [employer] app. Next doctor's apt. Scheduled for Jan 11th.   Physical activity: dance class 3x/wk  Dietary Intake:  Usual eating pattern includes 2-3 meals and 0-1 snacks per day. Dining out frequency: 6 meals per week.  Snacks: trail mix with dried fruit, peanuts, granola Beverages: drinking more water and less sodas, apple juice, coffee with light creamer  Nutrition Care Education: Topics covered: *Fast FoodsADA guidelinesgeneral, role of weight reduction and phys ed in preventing diabetes, fiber and protein roles in satiety, portion control, making  smart choices when eating out Basic nutrition: basic food groups, appropriate nutrient balance, appropriate meal and snack schedule, general nutrition guidelines    Weight control: benefits of weight control, behavioral changes for weight loss Advanced nutrition: dining out, food label reading Other lifestyle changes:  benefits of making changes, increasing motivation, readiness for change, identifying habits that need to change  Nutritional Diagnosis:  NI-1.7 Predicted excessive energy intake As related to weight gain.  As evidenced by BMI 38.65 and wt gain of 6.7# x4.5 months.  Intervention: Discussion as noted above. Patient's previous goals were reviewed though most were not executed. New list of goals were created and provided to patient to work on until her doctor's visit in January 2019 when new HgbA1c will be tested. Nutrition follow up apt. scheduled for January 11 at 12:30pm.  Education Materials given:  Marland Kitchen. Quick and Healthy Meals . Goals/ instructions  Learner/ who was taught:  . Patient  Level of understanding: . Partial understanding; needs review/ practice  Demonstrated degree of understanding via:   Teach back Learning barriers: . None  Willingness to learn/ readiness for change: . Eager, change in progress  Monitoring and Evaluation:  Dietary intake, exercise, HgbA1c, and body weight      follow up: in 3 week(s): November 21, 2017

## 2017-11-21 ENCOUNTER — Ambulatory Visit (INDEPENDENT_AMBULATORY_CARE_PROVIDER_SITE_OTHER): Payer: BLUE CROSS/BLUE SHIELD | Admitting: Primary Care

## 2017-11-21 ENCOUNTER — Encounter: Payer: Self-pay | Admitting: Dietician

## 2017-11-21 ENCOUNTER — Encounter: Payer: Self-pay | Admitting: Primary Care

## 2017-11-21 ENCOUNTER — Encounter: Payer: BLUE CROSS/BLUE SHIELD | Attending: Primary Care | Admitting: Dietician

## 2017-11-21 VITALS — BP 112/70 | HR 67 | Temp 97.8°F | Ht 69.0 in | Wt 258.8 lb

## 2017-11-21 VITALS — Wt 258.0 lb

## 2017-11-21 DIAGNOSIS — E063 Autoimmune thyroiditis: Secondary | ICD-10-CM

## 2017-11-21 DIAGNOSIS — Z23 Encounter for immunization: Secondary | ICD-10-CM

## 2017-11-21 DIAGNOSIS — G43009 Migraine without aura, not intractable, without status migrainosus: Secondary | ICD-10-CM | POA: Diagnosis not present

## 2017-11-21 DIAGNOSIS — I10 Essential (primary) hypertension: Secondary | ICD-10-CM | POA: Diagnosis not present

## 2017-11-21 DIAGNOSIS — R7303 Prediabetes: Secondary | ICD-10-CM

## 2017-11-21 DIAGNOSIS — M25569 Pain in unspecified knee: Secondary | ICD-10-CM

## 2017-11-21 DIAGNOSIS — K219 Gastro-esophageal reflux disease without esophagitis: Secondary | ICD-10-CM | POA: Insufficient documentation

## 2017-11-21 DIAGNOSIS — M25561 Pain in right knee: Secondary | ICD-10-CM

## 2017-11-21 DIAGNOSIS — Z6838 Body mass index (BMI) 38.0-38.9, adult: Secondary | ICD-10-CM

## 2017-11-21 DIAGNOSIS — G8929 Other chronic pain: Secondary | ICD-10-CM

## 2017-11-21 DIAGNOSIS — R1013 Epigastric pain: Secondary | ICD-10-CM

## 2017-11-21 DIAGNOSIS — Z0000 Encounter for general adult medical examination without abnormal findings: Secondary | ICD-10-CM | POA: Diagnosis not present

## 2017-11-21 DIAGNOSIS — E6609 Other obesity due to excess calories: Secondary | ICD-10-CM

## 2017-11-21 DIAGNOSIS — L709 Acne, unspecified: Secondary | ICD-10-CM

## 2017-11-21 DIAGNOSIS — J4540 Moderate persistent asthma, uncomplicated: Secondary | ICD-10-CM | POA: Diagnosis not present

## 2017-11-21 LAB — COMPREHENSIVE METABOLIC PANEL
ALT: 16 U/L (ref 0–35)
AST: 16 U/L (ref 0–37)
Albumin: 4.2 g/dL (ref 3.5–5.2)
Alkaline Phosphatase: 61 U/L (ref 39–117)
BILIRUBIN TOTAL: 0.4 mg/dL (ref 0.2–1.2)
BUN: 11 mg/dL (ref 6–23)
CO2: 29 meq/L (ref 19–32)
Calcium: 9.7 mg/dL (ref 8.4–10.5)
Chloride: 101 mEq/L (ref 96–112)
Creatinine, Ser: 0.77 mg/dL (ref 0.40–1.20)
GFR: 122.28 mL/min (ref >60.00)
GLUCOSE: 93 mg/dL (ref 70–99)
Potassium: 3.9 mEq/L (ref 3.5–5.1)
SODIUM: 136 meq/L (ref 135–145)
Total Protein: 8 g/dL (ref 6.0–8.3)

## 2017-11-21 LAB — LIPID PANEL
CHOL/HDL RATIO: 3
Cholesterol: 126 mg/dL (ref 0–200)
HDL: 40.2 mg/dL (ref >39.00)
LDL Cholesterol: 77 mg/dL (ref 0–99)
NONHDL: 86.03
Triglycerides: 43 mg/dL (ref 0.0–149.0)
VLDL: 8.6 mg/dL (ref 0.0–40.0)

## 2017-11-21 LAB — TSH: TSH: 1.1 u[IU]/mL (ref 0.35–5.50)

## 2017-11-21 LAB — HEMOGLOBIN A1C: Hgb A1c MFr Bld: 6 % (ref 4.6–6.5)

## 2017-11-21 MED ORDER — NORGESTIM-ETH ESTRAD TRIPHASIC 0.18/0.215/0.25 MG-25 MCG PO TABS: 1.0000 | ORAL_TABLET | Freq: Every day | ORAL | 1 refills | Status: DC

## 2017-11-21 NOTE — Addendum Note (Signed)
Addended by: Tawnya CrookSAMBATH, Vernona Peake on: 11/21/2017 04:14 PM   Modules accepted: Orders

## 2017-11-21 NOTE — Assessment & Plan Note (Signed)
Managed on topiramate 150 mg BID, doing well overall. Using Relpax three times weekly. Following with Neurology.

## 2017-11-21 NOTE — Assessment & Plan Note (Signed)
Stable on omeprazole 10 mg, continue to work on weight loss through diet and exercise.

## 2017-11-21 NOTE — Patient Instructions (Addendum)
Stop by the lab prior to leaving today. I will notify you of your results once received.   It's important to improve your diet by reducing consumption of fast food, fried food, processed snack foods, sugary drinks. Increase consumption of fresh vegetables and fruits, whole grains, water.  Ensure you are drinking 64 ounces of water daily.  Start exercising. You should be getting 150 minutes of moderate intensity exercise weekly.  Birth Control:  Take your first birth control pill on the Sunday after your period starts. For example, if you start your period on Monday, then take the birth control pill that following Sunday. If you start your period on Sunday, then take your first pill that same day.  You must take your pill at the same time each day.  If you miss a pill then take the pill as soon as you remember, and also take your pill at the regularly scheduled time, even if this means taking two pills in one day. If you miss more than two pills consecutively, then please call me.  It may take three to six months for birth control pills to regulate your cycle and improve symptoms.  Follow up in 1 year for your annual exam or sooner if needed.  It was a pleasure to see you today!   Preventive Care 18-39 Years, Female Preventive care refers to lifestyle choices and visits with your health care provider that can promote health and wellness. What does preventive care include?  A yearly physical exam. This is also called an annual well check.  Dental exams once or twice a year.  Routine eye exams. Ask your health care provider how often you should have your eyes checked.  Personal lifestyle choices, including: ? Daily care of your teeth and gums. ? Regular physical activity. ? Eating a healthy diet. ? Avoiding tobacco and drug use. ? Limiting alcohol use. ? Practicing safe sex. ? Taking vitamin and mineral supplements as recommended by your health care provider. What happens during  an annual well check? The services and screenings done by your health care provider during your annual well check will depend on your age, overall health, lifestyle risk factors, and family history of disease. Counseling Your health care provider may ask you questions about your:  Alcohol use.  Tobacco use.  Drug use.  Emotional well-being.  Home and relationship well-being.  Sexual activity.  Eating habits.  Work and work Statistician.  Method of birth control.  Menstrual cycle.  Pregnancy history.  Screening You may have the following tests or measurements:  Height, weight, and BMI.  Diabetes screening. This is done by checking your blood sugar (glucose) after you have not eaten for a while (fasting).  Blood pressure.  Lipid and cholesterol levels. These may be checked every 5 years starting at age 56.  Skin check.  Hepatitis C blood test.  Hepatitis B blood test.  Sexually transmitted disease (STD) testing.  BRCA-related cancer screening. This may be done if you have a family history of breast, ovarian, tubal, or peritoneal cancers.  Pelvic exam and Pap test. This may be done every 3 years starting at age 41. Starting at age 70, this may be done every 5 years if you have a Pap test in combination with an HPV test.  Discuss your test results, treatment options, and if necessary, the need for more tests with your health care provider. Vaccines Your health care provider may recommend certain vaccines, such as:  Influenza vaccine. This is  recommended every year.  Tetanus, diphtheria, and acellular pertussis (Tdap, Td) vaccine. You may need a Td booster every 10 years.  Varicella vaccine. You may need this if you have not been vaccinated.  HPV vaccine. If you are 38 or younger, you may need three doses over 6 months.  Measles, mumps, and rubella (MMR) vaccine. You may need at least one dose of MMR. You may also need a second dose.  Pneumococcal 13-valent  conjugate (PCV13) vaccine. You may need this if you have certain conditions and were not previously vaccinated.  Pneumococcal polysaccharide (PPSV23) vaccine. You may need one or two doses if you smoke cigarettes or if you have certain conditions.  Meningococcal vaccine. One dose is recommended if you are age 35-21 years and a first-year college student living in a residence hall, or if you have one of several medical conditions. You may also need additional booster doses.  Hepatitis A vaccine. You may need this if you have certain conditions or if you travel or work in places where you may be exposed to hepatitis A.  Hepatitis B vaccine. You may need this if you have certain conditions or if you travel or work in places where you may be exposed to hepatitis B.  Haemophilus influenzae type b (Hib) vaccine. You may need this if you have certain risk factors.  Talk to your health care provider about which screenings and vaccines you need and how often you need them. This information is not intended to replace advice given to you by your health care provider. Make sure you discuss any questions you have with your health care provider. Document Released: 12/24/2001 Document Revised: 07/17/2016 Document Reviewed: 08/29/2015 Elsevier Interactive Patient Education  [employer].

## 2017-11-21 NOTE — Assessment & Plan Note (Signed)
[place], also participating in physical therapy.

## 2017-11-21 NOTE — Assessment & Plan Note (Signed)
Compliant to Qvar daily, using albuterol 2-3 times weekly, using nebulizer four times weekly for the past 2 weeks due to weather changes.

## 2017-11-21 NOTE — Assessment & Plan Note (Signed)
Stable on omeprazole 10 mg, she is working on improving her diet.

## 2017-11-21 NOTE — Progress Notes (Signed)
Medical Nutrition Therapy: Visit start time: 1200  end time: 1230  Assessment:  Diagnosis: prediabetes Medical history changes: none Psychosocial issues/ stress concerns: none  Current weight: 258lb  Height: 5\' 9"  Medications, supplement changes: viVovo, see chart  Progress and evaluation: Patient has made progress towards her goals. Weight loss of 3.7# in approx 1 month. She has improved her dietary choices in several ways. When eating out she chooses grilled meats rather than fried and opts for a yogurt parfait or fruit salad side rather than french fries. Overall she is eating out less often. She no longer drinks sodas and does not eat red meat. Drinks more water and when drinking juice, chooses 100% juice. At her meals she is choosing less starchy foods and more vegetables and lean proteins. As a result she has been feeling full more quickly at meal times. Snacks unchanged; granola bars, yogurt, trail mix. States the [employer] Meals handout previously provided was helpful for meal selections. Continues to track calories "off and on" in the MyFitnessPal app. Reports to continue to struggle occasionally with eating lunch regularly d/t work/school schedule but feels that this semester her lighter class load will allow time for lunch on a more regular basis. During the Holidays she felt limited in her food choices d/t her family traditionally eating mostly red meat. Admits to liking fruit but not regularly incorporating it into her daily eating schedule.   Physical activity: Light walking, dancing 3x/wk. Plans to join [employer] within the next couple of weeks. States that she is fully ready to commit now that she knows her class schedule will be lighter than last semester.  Dietary Intake:  Usual eating pattern includes 2-3 meals and 0-2 snacks per day. Dining out frequency: 3 meals per week.   Nutrition Care Education: Topics covered: *Food Diary Weight management guidelinesgeneral,  types of snacks to promote satiety, high protein snacks, ways to incorporate fruits into the diet daily Basic nutrition: basic food groups, appropriate nutrient balance, appropriate meal and snack schedule, general nutrition guidelines    Weight control: benefits of weight control, behavioral changes for weight loss Advanced nutrition: cooking techniques, dining out Other lifestyle changes: benefits of making changes, increasing motivation, readiness for change, identifying habits that need to change  Nutritional Diagnosis:  NB-2.1 Physical inactivity As related to lifestyle habits.  As evidenced by patient report of not reaching her goal of joining a gym, though does plan to. [place]-3.3 Overweight/obesity As related to previous lifestyle habits.  As evidenced by BMI 38.1.  Intervention: Discussion as noted above. Patient is making progress towards her goals and is motivated to continue to improve her lifestyle choices. She will start going to the gym to increase physical activity, continue to make healthier choices when eating out, improve quality of beverages consumed, cook more meals at home, and fill her plate with lean proteins, vegetables and controlled portions of starches.   Education Materials given:  Marland Kitchen. Goals/ instructions  Learner/ who was taught:  . Patient   Level of understanding: Marland Kitchen. Verbalizes/ demonstrates competency  Demonstrated degree of understanding via:   Teach back Learning barriers: . None  Willingness to learn/ readiness for change: . Eager, change in progress  Monitoring and Evaluation:  Dietary intake, exercise, HgbA1c and lipids post today's lab results, and body weight      follow up: prn: to call patient once lab results are in and schedule a follow up based upon results

## 2017-11-21 NOTE — Progress Notes (Signed)
Subjective:    Patient ID: Kathryn [name], female    DOB: 07/04/1996, 22 y.o.   MRN: [id number]  HPI  Ms. Kathryn ConstantMoore is a 22 year old female who presents today for complete physical.  Immunizations: -Tetanus: Unsure, believes she's due -Influenza: Declines    Diet: She endorses a fair diet. Breakfast: Cereal, yogurt with granola, oatmeal, grits Lunch: Chicken, fish (baked or fried), sometimes skips Dinner: Vegetables, chicken, pasta Snacks: Granola, fruit, peanuts Desserts: None in the past 2 months. Beverages: Water, coffee  Exercise: She works out at [employer] gym 2-3 times weekly  Eye exam: Completed in June 2018 Dental exam: Completes semi-annually Pap Smear: Due in July 2019. Following with GYN.   Review of Systems  Constitutional: Negative for unexpected weight change.  HENT: Negative for rhinorrhea.   Respiratory: Negative for cough and shortness of breath.   Cardiovascular: Negative for chest pain.  Gastrointestinal: Negative for constipation and diarrhea.  Genitourinary: Negative for difficulty urinating and menstrual problem.  Musculoskeletal: Negative for arthralgias and myalgias.  Skin: Negative for rash.       Body acne to left axilla  Allergic/Immunologic: Negative for environmental allergies.  Neurological: Negative for dizziness, numbness and headaches.  Psychiatric/Behavioral: The patient is not nervous/anxious.        Past Medical History:  Diagnosis Date  . Acanthosis nigricans   . Asthma   . Dyspepsia   . Goiter   . Headache   . Hypertension   . Isosexual precocity   . Pre-diabetes   . Thyroiditis, autoimmune      Social History   Socioeconomic History  . Marital status: Single    Spouse name: Not on file  . Number of children: Not on file  . Years of education: Not on file  . Highest education level: Not on file  Social Needs  . Financial resource strain: Not on file  . Food insecurity - worry: Not on file  . Food insecurity - inability:  Not on file  . Transportation needs - medical: Not on file  . Transportation needs - non-medical: Not on file  Occupational History  . Not on file  Tobacco Use  . Smoking status: Never Smoker  . Smokeless tobacco: Never Used  Substance and Sexual Activity  . Alcohol use: No  . Drug use: No  . Sexual activity: Yes    Birth control/protection: Condom  Other Topics Concern  . 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   She has 4 siblings, 24, 24, 8, and 21 yo.    She enjoys dancing and fishing.   Studying banking and finance.    No past surgical history on file.  Family History  Problem Relation Age of Onset  . Heart disease Paternal Grandmother   . Hypertension Paternal Grandmother   . Asthma Mother   . Alcohol abuse Father   . Drug abuse Father   . Asthma Sister   . Asthma Brother   . Mental illness Brother   . Cancer Maternal Grandmother   . Hypertension Maternal Grandmother   . Breast cancer Maternal Grandmother   . COPD Maternal Grandfather   . Depression Maternal Grandfather   . Learning disabilities Maternal Grandfather     No Known Allergies  Current Outpatient Medications on File Prior to Visit  Medication Sig Dispense Refill  . albuterol (PROVENTIL) (2.5 MG/3ML) 0.083% nebulizer solution Take 3 mLs (2.5 mg total) by nebulization every  6 (six) hours as needed for wheezing or shortness of breath. 75 mL 1  . beclomethasone (QVAR) 80 MCG/ACT inhaler Inhale 2 puffs into the lungs 2 (two) times daily. 1 Inhaler 5  . cetirizine (ZYRTEC) 10 MG tablet Take 10 mg by mouth daily.    . Multiple Vitamins-Minerals (MULTIVITAMIN GUMMIES ADULTS PO) Take 1 tablet by mouth daily.    Marland Kitchen omeprazole (PRILOSEC) 10 MG capsule Take 1 capsule (10 mg total) by mouth daily. 90 capsule 1  . PATADAY 0.2 % SOLN Place 1 drop into both eyes daily.   6  . PROAIR HFA 108 (90 Base) MCG/ACT inhaler Inhale 2 puffs into the lungs every 6 (six)  hours as needed for wheezing or shortness of breath. 1 Inhaler 5  . RELPAX 40 MG tablet Take one tablet at onset of migraine with 400 mg of ibuprofen may repeat in 2 hours if headache persists or recurs. 10 tablet 5  . tiZANidine (ZANAFLEX) 2 MG tablet Take 1 tablet (2 mg total) by mouth daily. 30 tablet 1  . topiramate (TOPAMAX) 25 MG tablet TAKE 3 TABLETS IN THE MORNING AND 3 TABLETS AT NIGHTTIME 186 tablet 5  . diclofenac (VOLTAREN) 75 MG EC tablet Take one tablet daily. (Patient not taking: Reported on 06/17/2017) 30 tablet 1   No current facility-administered medications on file prior to visit.     BP 112/70   Pulse 67   Temp 97.8 F (36.6 C) (Oral)   Ht 5\' 9"  (1.753 m)   Wt 258 lb 12.8 oz (117.4 kg)   LMP 10/29/2017   SpO2 99%   BMI 38.22 kg/m    Objective:   Physical Exam  Constitutional: She is oriented to person, place, and time. She appears well-nourished.  HENT:  Right Ear: Tympanic membrane and ear canal normal.  Left Ear: Tympanic membrane and ear canal normal.  Nose: Nose normal.  Mouth/Throat: Oropharynx is clear and moist.  Eyes: Conjunctivae and EOM are normal. Pupils are equal, round, and reactive to light.  Neck: Neck supple. No thyromegaly present.  Cardiovascular: Normal rate and regular rhythm.  No murmur heard. Pulmonary/Chest: Effort normal and breath sounds normal. She has no rales.  Abdominal: Soft. Bowel sounds are normal. There is no tenderness.  Musculoskeletal: Normal range of motion.  Lymphadenopathy:    She has no cervical adenopathy.  Neurological: She is alert and oriented to person, place, and time. She has normal reflexes. No cranial nerve deficit.  Skin: Skin is warm and dry. No rash noted.  Psychiatric: She has a normal mood and affect.          Assessment & Plan:

## 2017-11-21 NOTE — Assessment & Plan Note (Signed)
Stable in the office today, continue to monitor. Work on weight loss as discussed.

## 2017-11-21 NOTE — Assessment & Plan Note (Signed)
TSH pending today, exam unremarkable.

## 2017-11-21 NOTE — Assessment & Plan Note (Signed)
Tdap due, provided today, declines influenza vaccination. Pap smear due next year as she will be 22. Discussed the importance of a healthy diet and regular exercise in order for weight loss, and to reduce the risk of any potential medical problems. Exam unremarkable. Labs pending. Follow up in 1 year.

## 2017-11-21 NOTE — Assessment & Plan Note (Signed)
Due for repeat A1C today, pending. Discussed the importance of a healthy diet and regular exercise in order for weight loss, and to reduce the risk of any potential medical problems.

## 2017-11-26 ENCOUNTER — Telehealth: Payer: Self-pay | Admitting: Dietician

## 2017-11-26 NOTE — Telephone Encounter (Signed)
Attempted to reach patient for follow up appointment s/p lab results, no answer and mailbox full

## 2017-12-04 ENCOUNTER — Encounter (INDEPENDENT_AMBULATORY_CARE_PROVIDER_SITE_OTHER): Payer: Self-pay | Admitting: Family

## 2018-04-25 ENCOUNTER — Encounter: Payer: Self-pay | Admitting: Primary Care

## 2018-04-30 ENCOUNTER — Ambulatory Visit: Payer: BLUE CROSS/BLUE SHIELD | Admitting: Primary Care

## 2018-04-30 ENCOUNTER — Encounter: Payer: Self-pay | Admitting: Primary Care

## 2018-04-30 ENCOUNTER — Other Ambulatory Visit: Payer: Self-pay | Admitting: Primary Care

## 2018-04-30 VITALS — BP 120/74 | HR 81 | Temp 98.3°F | Ht 69.0 in | Wt 256.8 lb

## 2018-04-30 DIAGNOSIS — J4540 Moderate persistent asthma, uncomplicated: Secondary | ICD-10-CM | POA: Diagnosis not present

## 2018-04-30 DIAGNOSIS — J309 Allergic rhinitis, unspecified: Secondary | ICD-10-CM

## 2018-04-30 DIAGNOSIS — F329 Major depressive disorder, single episode, unspecified: Secondary | ICD-10-CM

## 2018-04-30 DIAGNOSIS — J4541 Moderate persistent asthma with (acute) exacerbation: Secondary | ICD-10-CM

## 2018-04-30 DIAGNOSIS — K219 Gastro-esophageal reflux disease without esophagitis: Secondary | ICD-10-CM

## 2018-04-30 DIAGNOSIS — F32A Depression, unspecified: Secondary | ICD-10-CM

## 2018-04-30 DIAGNOSIS — F419 Anxiety disorder, unspecified: Secondary | ICD-10-CM

## 2018-04-30 MED ORDER — FLUOXETINE HCL 10 MG PO TABS: ORAL_TABLET | ORAL | 1 refills | Status: DC

## 2018-04-30 MED ORDER — OMEPRAZOLE 10 MG PO CPDR: DELAYED_RELEASE_CAPSULE | ORAL | 1 refills | Status: DC

## 2018-04-30 MED ORDER — BECLOMETHASONE DIPROPIONATE 80 MCG/ACT IN AERS: 2.0000 | INHALATION_SPRAY | Freq: Two times a day (BID) | RESPIRATORY_TRACT | 5 refills | Status: DC

## 2018-04-30 MED ORDER — CETIRIZINE HCL 10 MG PO TABS: ORAL_TABLET | ORAL | 3 refills | Status: AC

## 2018-04-30 MED ORDER — PREDNISONE 20 MG PO TABS: ORAL_TABLET | ORAL | 0 refills | Status: DC

## 2018-04-30 MED ORDER — AZITHROMYCIN 250 MG PO TABS: ORAL_TABLET | ORAL | 0 refills | Status: DC

## 2018-04-30 NOTE — Progress Notes (Signed)
Subjective:    Patient ID: Kathryn [name], female    DOB: 30-Dec-1995, 22 y.o.   MRN: [id number]  HPI  Kathryn [name] is a 22 year old female who presents today with multiple complaints.  1) Wheezing: History of moderate persistent asthma and is managed on Qvar and albuterol PRN.   Also with shortness of breath, chest congestion, cough. Her cough is productive with green sputum. Her symptoms began 2 weeks ago. She denies fevers. Overall she's not feeling much better.   2) Depression and Anxiety/Panic Attacks: Present for the past 4-5 months, worse over the last 2 months. Symptoms include experiencing anxiety, panic attacks, feeling sad, difficulty sleeping, daily worry.   GAD 7 score of 15 and PHQ 9 score of 18 today. She denies recent SI/HI, has had these thoughts in the past. She's never discussed these symptoms with any provider in the past, feels like she needs help.   She is leaving in 2 days for a trip to [place] for a [employer].   Review of Systems  Constitutional: Positive for fatigue.  HENT: Positive for congestion. Negative for sinus pressure and sore throat.   Respiratory: Positive for cough, chest tightness, shortness of breath and wheezing.   Cardiovascular: Negative for chest pain.  Psychiatric/Behavioral: Positive for sleep disturbance. The patient is nervous/anxious.        See HPI       Past Medical History:  Diagnosis Date  . Acanthosis nigricans   . Asthma   . Dyspepsia   . Goiter   . Headache   . Hidradenitis suppurativa    axilla  . Hypertension   . Isosexual precocity   . Pre-diabetes   . Thyroiditis, autoimmune      Social History   Socioeconomic History  . Marital status: Single    Spouse name: Not on file  . Number of children: Not on file  . Years of education: Not on file  . Highest education level: Not on file  Occupational History  . Not on file  Social Needs  . Financial resource strain: Not on file  . Food insecurity:   Worry: Not on file    Inability: Not on file  . Transportation needs:    Medical: Not on file    Non-medical: Not on file  Tobacco Use  . Smoking status: Never Smoker  . Smokeless tobacco: Never Used  Substance and Sexual Activity  . Alcohol use: No  . Drug use: No  . Sexual activity: Yes    Birth control/protection: Condom  Lifestyle  . Physical activity:    Days per week: Not on file    Minutes per session: Not on file  . Stress: Not on file  Relationships  . Social connections:    Talks on phone: Not on file    Gets together: Not on file    Attends religious service: Not on file    Active member of club or organization: Not on file    Attends meetings of clubs or organizations: Not on file    Relationship status: Not on file  . Intimate partner violence:    Fear of current or ex partner: Not on file    Emotionally abused: Not on file    Physically abused: Not on file    Forced sexual activity: Not on file  Other Topics Concern  . 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   She  has 4 siblings, 24, 24, 8, and 62 yo.    She enjoys dancing and fishing.   Studying banking and finance.    No past surgical history on file.  Family History  Problem Relation Age of Onset  . Heart disease Paternal Grandmother   . Hypertension Paternal Grandmother   . Asthma Mother   . Alcohol abuse Father   . Drug abuse Father   . Asthma Sister   . Asthma Brother   . Mental illness Brother   . Cancer Maternal Grandmother   . Hypertension Maternal Grandmother   . Breast cancer Maternal Grandmother   . COPD Maternal Grandfather   . Depression Maternal Grandfather   . Learning disabilities Maternal Grandfather     No Known Allergies  Current Outpatient Medications on File Prior to Visit  Medication Sig Dispense Refill  . albuterol (PROVENTIL) (2.5 MG/3ML) 0.083% nebulizer solution Take 3 mLs (2.5 mg total) by nebulization every 6  (six) hours as needed for wheezing or shortness of breath. 75 mL 1  . beclomethasone (QVAR) 80 MCG/ACT inhaler Inhale 2 puffs into the lungs 2 (two) times daily. 1 Inhaler 5  . cetirizine (ZYRTEC) 10 MG tablet Take 10 mg by mouth daily.    . Multiple Vitamins-Minerals (MULTIVITAMIN GUMMIES ADULTS PO) Take 1 tablet by mouth daily.    . Norgestimate-Ethinyl Estradiol Triphasic (ORTHO TRI-CYCLEN LO) 0.18/0.215/0.25 MG-25 MCG tab Take 1 tablet by mouth daily. 3 Package 1  . omeprazole (PRILOSEC) 10 MG capsule Take 1 capsule (10 mg total) by mouth daily. 90 capsule 1  . PATADAY 0.2 % SOLN Place 1 drop into both eyes daily.   6  . PROAIR HFA 108 (90 Base) MCG/ACT inhaler Inhale 2 puffs into the lungs every 6 (six) hours as needed for wheezing or shortness of breath. 1 Inhaler 5  . RELPAX 40 MG tablet Take one tablet at onset of migraine with 400 mg of ibuprofen may repeat in 2 hours if headache persists or recurs. 10 tablet 5  . tiZANidine (ZANAFLEX) 2 MG tablet Take 1 tablet (2 mg total) by mouth daily. 30 tablet 1  . topiramate (TOPAMAX) 25 MG tablet TAKE 3 TABLETS IN THE MORNING AND 3 TABLETS AT NIGHTTIME 186 tablet 5   No current facility-administered medications on file prior to visit.     BP 120/74   Pulse 81   Temp 98.3 F (36.8 C) (Oral)   Ht 5\' 9"  (1.753 m)   Wt 256 lb 12 oz (116.5 kg)   LMP 03/26/2018   SpO2 97%   BMI 37.92 kg/m    Objective:   Physical Exam  Constitutional: She appears well-nourished. She does not appear ill.  HENT:  Right Ear: Tympanic membrane and ear canal normal.  Left Ear: Tympanic membrane and ear canal normal.  Nose: No mucosal edema. Right sinus exhibits no maxillary sinus tenderness and no frontal sinus tenderness. Left sinus exhibits no maxillary sinus tenderness and no frontal sinus tenderness.  Mouth/Throat: Oropharynx is clear and moist.  Neck: Neck supple.  Cardiovascular: Normal rate and regular rhythm.  Respiratory: Effort normal and breath  sounds normal. She has no wheezes.  Tight airways, difficulty in auscultation   Skin: Skin is warm and dry.           Assessment & Plan:  Acute Asthma Exacerbation:  Symptoms x 2 weeks. Exam today without obvious wheezing but tight airways. Rx for Zpak and prednisone courses sent to pharmacy. Discussed to continue Qvar and  use albuterol PRN. Follow up PRN.  Doreene NestKatherine K Jordanna Hendrie, NP

## 2018-04-30 NOTE — Patient Instructions (Addendum)
Start Azithromycin antibiotics for infection. Take 2 tablets by mouth today, then 1 tablet daily for 4 additional days.  Start prednisone tablets for asthma. Take 2 tablets daily for 5 days.  Use your albuterol inhaler as needed. Continue daily use of your Qvar.  Start fluoxetine (Prozac) 10 mg tablets for anxiety and depression when you come back from your trip. Take 1 tablet by mouth every morning.  Schedule a follow up visit in 6 weeks for re-evaluation of anxiety and depression.  It was a pleasure to see you today!

## 2018-04-30 NOTE — Telephone Encounter (Signed)
Will you please confirm with the pharmacy that all Qvar has been discontinued? If so then notify the patient that I'll be sending in an alternative called fluticasone (Flovent).  Let me know, thanks!

## 2018-04-30 NOTE — Assessment & Plan Note (Signed)
Symptoms for nearly 6 months, sounds like she's had symptoms in prior years. PHQ 9 score of 18 and GAD 7 score of 15 today.  Discussed various options for treatment, she elects for both therapy and medication. Referral placed for therapy.  Rx for fluoxetine 10 mg tablets sent to pharmacy, discussed to start after she returns from her trip. We discussed possible side effects of headache, GI upset, drowsiness, and SI/HI. If thoughts of SI/HI develop, we discussed to present to the emergency immediately. Patient verbalized understanding.   Follow up in 6 weeks for re-evaluation.

## 2018-05-01 NOTE — Telephone Encounter (Signed)
Noted, refill sent to pharmacy. 

## 2018-05-01 NOTE — Telephone Encounter (Signed)
Spoken to [facility] and was told the old product Qvar is no longer available but the new product is Special educational needs teacherQvar RediHaler.

## 2018-05-20 ENCOUNTER — Other Ambulatory Visit: Payer: Self-pay | Admitting: Primary Care

## 2018-05-20 DIAGNOSIS — L709 Acne, unspecified: Secondary | ICD-10-CM

## 2018-05-21 ENCOUNTER — Ambulatory Visit (INDEPENDENT_AMBULATORY_CARE_PROVIDER_SITE_OTHER): Payer: BLUE CROSS/BLUE SHIELD | Admitting: Psychology

## 2018-05-21 ENCOUNTER — Encounter: Payer: Self-pay | Admitting: Primary Care

## 2018-05-21 DIAGNOSIS — F332 Major depressive disorder, recurrent severe without psychotic features: Secondary | ICD-10-CM

## 2018-05-26 ENCOUNTER — Ambulatory Visit (INDEPENDENT_AMBULATORY_CARE_PROVIDER_SITE_OTHER): Payer: BLUE CROSS/BLUE SHIELD | Admitting: Psychology

## 2018-05-26 DIAGNOSIS — F332 Major depressive disorder, recurrent severe without psychotic features: Secondary | ICD-10-CM

## 2018-06-02 ENCOUNTER — Other Ambulatory Visit: Payer: Self-pay | Admitting: Primary Care

## 2018-06-02 DIAGNOSIS — J4540 Moderate persistent asthma, uncomplicated: Secondary | ICD-10-CM

## 2018-06-05 ENCOUNTER — Ambulatory Visit (INDEPENDENT_AMBULATORY_CARE_PROVIDER_SITE_OTHER): Payer: BLUE CROSS/BLUE SHIELD | Admitting: Psychology

## 2018-06-05 DIAGNOSIS — F332 Major depressive disorder, recurrent severe without psychotic features: Secondary | ICD-10-CM | POA: Diagnosis not present

## 2018-06-11 ENCOUNTER — Ambulatory Visit: Payer: BLUE CROSS/BLUE SHIELD | Admitting: Primary Care

## 2018-06-12 ENCOUNTER — Ambulatory Visit (INDEPENDENT_AMBULATORY_CARE_PROVIDER_SITE_OTHER): Payer: BLUE CROSS/BLUE SHIELD | Admitting: Psychology

## 2018-06-12 DIAGNOSIS — F332 Major depressive disorder, recurrent severe without psychotic features: Secondary | ICD-10-CM | POA: Diagnosis not present

## 2018-06-15 ENCOUNTER — Ambulatory Visit (INDEPENDENT_AMBULATORY_CARE_PROVIDER_SITE_OTHER): Payer: BLUE CROSS/BLUE SHIELD | Admitting: Psychology

## 2018-06-15 DIAGNOSIS — F332 Major depressive disorder, recurrent severe without psychotic features: Secondary | ICD-10-CM | POA: Diagnosis not present

## 2018-06-17 ENCOUNTER — Encounter: Payer: Self-pay | Admitting: Primary Care

## 2018-06-17 ENCOUNTER — Ambulatory Visit: Payer: BLUE CROSS/BLUE SHIELD | Admitting: Primary Care

## 2018-06-17 DIAGNOSIS — J4540 Moderate persistent asthma, uncomplicated: Secondary | ICD-10-CM

## 2018-06-17 DIAGNOSIS — F329 Major depressive disorder, single episode, unspecified: Secondary | ICD-10-CM | POA: Diagnosis not present

## 2018-06-17 DIAGNOSIS — F419 Anxiety disorder, unspecified: Secondary | ICD-10-CM | POA: Diagnosis not present

## 2018-06-17 MED ORDER — FLUOXETINE HCL 10 MG PO TABS: ORAL_TABLET | ORAL | 1 refills | Status: AC

## 2018-06-17 MED ORDER — PROAIR HFA 108 (90 BASE) MCG/ACT IN AERS: 2.0000 | INHALATION_SPRAY | Freq: Four times a day (QID) | RESPIRATORY_TRACT | 0 refills | Status: AC | PRN

## 2018-06-17 NOTE — Patient Instructions (Signed)
Continue taking fluoxetine 10 mg for anxiety and depression.  Continue with therapy.  Please notify me if you continue to need your albuterol inhaler daily after the weather gets cooler.   Please schedule a physical with me in January 2020.   It was a pleasure to see you today!

## 2018-06-17 NOTE — Assessment & Plan Note (Signed)
Compliant to Qvar BID, also using albuterol daily due to warmer weather. Discussed to use as needed and to notify if she continues to use during seasonal change.

## 2018-06-17 NOTE — Progress Notes (Signed)
Subjective:    Patient ID: Kathryn [name], female    DOB: 1996-09-17, 22 y.o.   MRN: [id number]  HPI  Kathryn [name] is a 22 year old female who presents today for follow up of anxiety and depression.  She was last evaluated in late June 2019 with reports of a 6 month history of panic attacks, feeling sad, difficulty sleeping, daily worry. GAD 7 score of 15 and PHQ 9 score of 18 that day so she was initiated on Fluoxetine 10 mg.   Since her last visit she's feeling better. Positive effects from fluoxetine includes feeling more calm and mellow. She is also following with a therapist regularly which has helped a lot. She denies SI/HI.  Review of Systems  Respiratory: Negative for shortness of breath.   Cardiovascular: Negative for chest pain.  Gastrointestinal: Negative for nausea.  Psychiatric/Behavioral: Negative for suicidal ideas.       Past Medical History:  Diagnosis Date  . Acanthosis nigricans   . Asthma   . Dyspepsia   . Goiter   . Headache   . Hidradenitis suppurativa    axilla  . Hypertension   . Isosexual precocity   . Pre-diabetes   . Thyroiditis, autoimmune      Social History   Socioeconomic History  . Marital status: Single    Spouse name: Not on file  . Number of children: Not on file  . Years of education: Not on file  . Highest education level: Not on file  Occupational History  . Not on file  Social Needs  . Financial resource strain: Not on file  . Food insecurity:    Worry: Not on file    Inability: Not on file  . Transportation needs:    Medical: Not on file    Non-medical: Not on file  Tobacco Use  . Smoking status: Never Smoker  . Smokeless tobacco: Never Used  Substance and Sexual Activity  . Alcohol use: No  . Drug use: No  . Sexual activity: Yes    Birth control/protection: Condom  Lifestyle  . Physical activity:    Days per week: Not on file    Minutes per session: Not on file  . Stress: Not on file  Relationships  . Social  connections:    Talks on phone: Not on file    Gets together: Not on file    Attends religious service: Not on file    Active member of club or organization: Not on file    Attends meetings of clubs or organizations: Not on file    Relationship status: Not on file  . Intimate partner violence:    Fear of current or ex partner: Not on file    Emotionally abused: Not on file    Physically abused: Not on file    Forced sexual activity: Not on file  Other Topics Concern  . 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    She enjoys dancing and fishing.   Studying banking and finance.    No past surgical history on file.  Family History  Problem Relation Age of Onset  . Heart disease Paternal Grandmother   . Hypertension Paternal Grandmother   . Asthma Mother   . Alcohol abuse Father   . Drug abuse Father   . Asthma Sister   . Asthma Brother   .  Mental illness Brother   . Cancer Maternal Grandmother   . Hypertension Maternal Grandmother   . Breast cancer Maternal Grandmother   . COPD Maternal Grandfather   . Depression Maternal Grandfather   . Learning disabilities Maternal Grandfather     No Known Allergies  Current Outpatient Medications on File Prior to Visit  Medication Sig Dispense Refill  . albuterol (PROVENTIL) (2.5 MG/3ML) 0.083% nebulizer solution TAKE 1 VIAL WITH NEBULIZER EVERY 6 HOURS AS NEEDED FOR WHEEZING AND SHORTNESS OF BREATH 75 mL 1  . cetirizine (ZYRTEC) 10 MG tablet Take 1 tablet by mouth at bedtime for allergies. 90 tablet 3  . Multiple Vitamins-Minerals (MULTIVITAMIN GUMMIES ADULTS PO) Take 1 tablet by mouth daily.    Marland Kitchen. omeprazole (PRILOSEC) 10 MG capsule Take 1 capsule by mouth once daily for heartburn. 90 capsule 1  . PATADAY 0.2 % SOLN Place 1 drop into both eyes daily.   6  . QVAR REDIHALER 80 MCG/ACT inhaler TAKE 2 PUFFS BY MOUTH TWICE A DAY 10.6 g 5  .  RELPAX 40 MG tablet Take one tablet at onset of migraine with 400 mg of ibuprofen may repeat in 2 hours if headache persists or recurs. 10 tablet 5  . tiZANidine (ZANAFLEX) 2 MG tablet Take 1 tablet (2 mg total) by mouth daily. 30 tablet 1  . topiramate (TOPAMAX) 25 MG tablet TAKE 3 TABLETS IN THE MORNING AND 3 TABLETS AT NIGHTTIME 186 tablet 5  . TRI-LO-MARZIA 0.18/0.215/0.25 MG-25 MCG tab TAKE 1 TABLET BY MOUTH EVERY DAY 84 tablet 1   No current facility-administered medications on file prior to visit.     BP 116/70   Pulse 74   Temp 98 F (36.7 C) (Oral)   Ht 5\' 9"  (1.753 m)   Wt 249 lb (112.9 kg)   LMP 05/17/2018   SpO2 98%   BMI 36.77 kg/m    Objective:   Physical Exam  Constitutional: She appears well-nourished.  Neck: Neck supple.  Cardiovascular: Normal rate and regular rhythm.  Respiratory: Effort normal and breath sounds normal. She has no wheezes.  Skin: Skin is warm and dry.  Psychiatric: She has a normal mood and affect.           Assessment & Plan:

## 2018-06-17 NOTE — Assessment & Plan Note (Signed)
Much improved on fluoxetine and also with therapy. Continue same. Refills sent to pharmacy. Denies SI/HI.

## 2018-06-26 ENCOUNTER — Ambulatory Visit: Payer: BLUE CROSS/BLUE SHIELD | Admitting: Psychology

## 2018-09-05 ENCOUNTER — Emergency Department (HOSPITAL_COMMUNITY): Payer: BLUE CROSS/BLUE SHIELD

## 2018-09-05 ENCOUNTER — Encounter (HOSPITAL_COMMUNITY): Payer: Self-pay | Admitting: Emergency Medicine

## 2018-09-05 ENCOUNTER — Emergency Department (HOSPITAL_COMMUNITY)
Admission: EM | Admit: 2018-09-05 | Discharge: 2018-09-06 | Disposition: A | Discharge status: Home / Self Care | Payer: BLUE CROSS/BLUE SHIELD | Attending: Emergency Medicine | Admitting: Emergency Medicine

## 2018-09-05 DIAGNOSIS — Z79899 Other long term (current) drug therapy: Secondary | ICD-10-CM | POA: Insufficient documentation

## 2018-09-05 DIAGNOSIS — R51 Headache: Secondary | ICD-10-CM | POA: Diagnosis present

## 2018-09-05 DIAGNOSIS — J45909 Unspecified asthma, uncomplicated: Secondary | ICD-10-CM | POA: Diagnosis not present

## 2018-09-05 DIAGNOSIS — M791 Myalgia, unspecified site: Secondary | ICD-10-CM | POA: Insufficient documentation

## 2018-09-05 DIAGNOSIS — R079 Chest pain, unspecified: Secondary | ICD-10-CM | POA: Insufficient documentation

## 2018-09-05 DIAGNOSIS — M25561 Pain in right knee: Secondary | ICD-10-CM | POA: Insufficient documentation

## 2018-09-05 DIAGNOSIS — I10 Essential (primary) hypertension: Secondary | ICD-10-CM | POA: Insufficient documentation

## 2018-09-05 DIAGNOSIS — R109 Unspecified abdominal pain: Secondary | ICD-10-CM | POA: Insufficient documentation

## 2018-09-05 LAB — I-STAT CHEM 8, ED
BUN: 11 mg/dL (ref 6–20)
Calcium, Ion: 1.22 mmol/L (ref 1.15–1.40)
Chloride: 105 mmol/L (ref 98–111)
Creatinine, Ser: 0.8 mg/dL (ref 0.44–1.00)
Glucose, Bld: 85 mg/dL (ref 70–99)
HCT: 37 % (ref 36.0–46.0)
Hemoglobin: 12.6 g/dL (ref 12.0–15.0)
Potassium: 3.6 mmol/L (ref 3.5–5.1)
Sodium: 140 mmol/L (ref 135–145)
TCO2: 26 mmol/L (ref 22–32)

## 2018-09-05 LAB — I-STAT BETA HCG BLOOD, ED (MC, WL, AP ONLY)

## 2018-09-05 MED ORDER — MORPHINE SULFATE (PF) 4 MG/ML IV SOLN
4.0000 mg | Freq: Once | INTRAVENOUS | Status: AC
Start: 2018-09-05 — End: 2018-09-05
  Administered 2018-09-05: 4 mg via INTRAVENOUS
  Filled 2018-09-05: qty 1

## 2018-09-05 MED ORDER — IOPAMIDOL (ISOVUE-300) INJECTION 61%
100.0000 mL | Freq: Once | INTRAVENOUS | Status: AC | PRN
  Administered 2018-09-05: 100 mL via INTRAVENOUS

## 2018-09-05 MED ORDER — IOPAMIDOL (ISOVUE-300) INJECTION 61%
INTRAVENOUS | Status: AC
  Filled 2018-09-05: qty 100

## 2018-09-05 MED ORDER — MORPHINE SULFATE (PF) 4 MG/ML IV SOLN
4.0000 mg | Freq: Once | INTRAVENOUS | Status: AC
  Administered 2018-09-05: 4 mg via INTRAVENOUS
  Filled 2018-09-05: qty 1

## 2018-09-05 MED ORDER — NAPROXEN 500 MG PO TABS: 500.0000 mg | ORAL_TABLET | Freq: Two times a day (BID) | ORAL | 0 refills | Status: DC | PRN

## 2018-09-05 MED ORDER — SODIUM CHLORIDE 0.9 % IJ SOLN
INTRAMUSCULAR | Status: AC
  Administered 2018-09-05: 21:00:00
  Filled 2018-09-05: qty 50

## 2018-09-05 MED ORDER — METHOCARBAMOL 500 MG PO TABS: 500.0000 mg | ORAL_TABLET | Freq: Two times a day (BID) | ORAL | 0 refills | Status: DC | PRN
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
## 2018-09-05 NOTE — Discharge Instructions (Signed)
It was my pleasure taking care of you today!   Naproxen as needed for pain.  Robaxin is your muscle relaxer to take as needed.  If you need additional pain relief, you can take Tylenol every 8 hours.  Use crutches as needed for comfort. Ice and elevate knee throughout the day.  Follow up with your orthopedist listed if symptoms do not improve in one week.   Return to the ER for new or worsening symptoms, any additional concerns.

## 2018-09-05 NOTE — ED Notes (Signed)
Bed: ZO10 Expected date:  Expected time:  Means of arrival:  Comments: For ft 5
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
## 2018-10-04 ENCOUNTER — Other Ambulatory Visit: Payer: Self-pay | Admitting: Primary Care

## 2018-10-04 DIAGNOSIS — L709 Acne, unspecified: Secondary | ICD-10-CM

## 2018-11-09 ENCOUNTER — Other Ambulatory Visit: Payer: Self-pay | Admitting: Primary Care

## 2018-11-09 DIAGNOSIS — I10 Essential (primary) hypertension: Secondary | ICD-10-CM

## 2018-11-09 DIAGNOSIS — R7303 Prediabetes: Secondary | ICD-10-CM

## 2018-11-09 DIAGNOSIS — Z0000 Encounter for general adult medical examination without abnormal findings: Secondary | ICD-10-CM

## 2018-11-19 ENCOUNTER — Other Ambulatory Visit: Payer: BLUE CROSS/BLUE SHIELD

## 2018-11-19 NOTE — Addendum Note (Signed)
Addended by: Alvina Chou on: 11/19/2018 10:02 AM   Modules accepted: Orders

## 2018-11-23 ENCOUNTER — Encounter: Payer: BLUE CROSS/BLUE SHIELD | Admitting: Primary Care

## 2019-06-08 ENCOUNTER — Other Ambulatory Visit: Payer: Self-pay | Admitting: Primary Care

## 2019-06-08 DIAGNOSIS — L709 Acne, unspecified: Secondary | ICD-10-CM

## 2020-03-15 ENCOUNTER — Other Ambulatory Visit: Payer: Self-pay | Admitting: Primary Care

## 2020-03-15 DIAGNOSIS — K219 Gastro-esophageal reflux disease without esophagitis: Secondary | ICD-10-CM

## 2020-04-11 ENCOUNTER — Other Ambulatory Visit: Payer: Self-pay | Admitting: Primary Care

## 2020-04-11 DIAGNOSIS — K219 Gastro-esophageal reflux disease without esophagitis: Secondary | ICD-10-CM

## 2021-01-05 ENCOUNTER — Other Ambulatory Visit: Payer: Self-pay

## 2021-01-05 ENCOUNTER — Emergency Department (HOSPITAL_COMMUNITY)
Admission: EM | Admit: 2021-01-05 | Discharge: 2021-01-05 | Disposition: A | Discharge status: Home / Self Care | Payer: BC Managed Care – PPO | Attending: Emergency Medicine | Admitting: Emergency Medicine

## 2021-01-05 ENCOUNTER — Encounter (HOSPITAL_COMMUNITY): Payer: Self-pay

## 2021-01-05 ENCOUNTER — Emergency Department (HOSPITAL_COMMUNITY): Payer: BC Managed Care – PPO

## 2021-01-05 DIAGNOSIS — G43909 Migraine, unspecified, not intractable, without status migrainosus: Secondary | ICD-10-CM | POA: Diagnosis present

## 2021-01-05 DIAGNOSIS — G43809 Other migraine, not intractable, without status migrainosus: Secondary | ICD-10-CM

## 2021-01-05 DIAGNOSIS — R079 Chest pain, unspecified: Secondary | ICD-10-CM | POA: Diagnosis not present

## 2021-01-05 DIAGNOSIS — J4540 Moderate persistent asthma, uncomplicated: Secondary | ICD-10-CM | POA: Insufficient documentation

## 2021-01-05 DIAGNOSIS — I10 Essential (primary) hypertension: Secondary | ICD-10-CM | POA: Insufficient documentation

## 2021-01-05 LAB — CBC WITH DIFFERENTIAL/PLATELET
Abs Immature Granulocytes: 0.02 10*3/uL (ref 0.00–0.07)
Basophils Absolute: 0 10*3/uL (ref 0.0–0.1)
Basophils Relative: 0 %
Eosinophils Absolute: 0 10*3/uL (ref 0.0–0.5)
Eosinophils Relative: 0 %
HCT: 36.1 % (ref 36.0–46.0)
Hemoglobin: 11.6 g/dL — ABNORMAL LOW (ref 12.0–15.0)
Immature Granulocytes: 0 %
Lymphocytes Relative: 9 %
Lymphs Abs: 0.7 10*3/uL (ref 0.7–4.0)
MCH: 29.1 pg (ref 26.0–34.0)
MCHC: 32.1 g/dL (ref 30.0–36.0)
MCV: 90.7 fL (ref 80.0–100.0)
Monocytes Absolute: 0.3 10*3/uL (ref 0.1–1.0)
Monocytes Relative: 4 %
Neutro Abs: 6.9 10*3/uL (ref 1.7–7.7)
Neutrophils Relative %: 87 %
Platelets: 367 10*3/uL (ref 150–400)
RBC: 3.98 MIL/uL (ref 3.87–5.11)
RDW: 14.6 % (ref 11.5–15.5)
WBC: 8 10*3/uL (ref 4.0–10.5)
nRBC: 0 % (ref 0.0–0.2)

## 2021-01-05 LAB — BASIC METABOLIC PANEL
Anion gap: 10 (ref 5–15)
BUN: 13 mg/dL (ref 6–20)
CO2: 23 mmol/L (ref 22–32)
Calcium: 9.2 mg/dL (ref 8.9–10.3)
Chloride: 102 mmol/L (ref 98–111)
Creatinine, Ser: 0.88 mg/dL (ref 0.44–1.00)
GFR, Estimated: >60 mL/min (ref >60)
Glucose, Bld: 133 mg/dL — ABNORMAL HIGH (ref 70–99)
Potassium: 4.4 mmol/L (ref 3.5–5.1)
Sodium: 135 mmol/L (ref 135–145)

## 2021-01-05 LAB — I-STAT BETA HCG BLOOD, ED (MC, WL, AP ONLY): I-stat hCG, quantitative: <5 m[IU]/mL (ref <5)

## 2021-01-05 LAB — TROPONIN I (HIGH SENSITIVITY)
Troponin I (High Sensitivity): 9 ng/L (ref <18)
Troponin I (High Sensitivity): 8 ng/L (ref <18)

## 2021-01-05 MED ORDER — DIPHENHYDRAMINE HCL 50 MG/ML IJ SOLN
25.0000 mg | Freq: Once | INTRAMUSCULAR | Status: AC
  Administered 2021-01-05: 25 mg via INTRAVENOUS
  Filled 2021-01-05: qty 1

## 2021-01-05 MED ORDER — PROCHLORPERAZINE EDISYLATE 10 MG/2ML IJ SOLN
10.0000 mg | Freq: Once | INTRAMUSCULAR | Status: AC
  Administered 2021-01-05: 10 mg via INTRAVENOUS
  Filled 2021-01-05: qty 2

## 2021-01-05 MED ORDER — KETOROLAC TROMETHAMINE 30 MG/ML IJ SOLN
30.0000 mg | Freq: Once | INTRAMUSCULAR | Status: AC
  Administered 2021-01-05: 30 mg via INTRAVENOUS
  Filled 2021-01-05: qty 1

## 2021-01-05 NOTE — Discharge Instructions (Signed)
Please follow-up with your neurologist. Make sure you are staying hydrated drink plenty of fluids.  Return the emergency department for any worsening headache, chest pain, difficulty breathing, difficulty moving your arms or legs, vomiting or any other worsening concerning symptoms.

## 2021-01-05 NOTE — ED Triage Notes (Signed)
Patient c/o migraine and states a history of the same. Patient also c/o blurred vision and reports that she vomited x 1.

## 2021-01-05 NOTE — ED Provider Notes (Signed)
[facility]-EMERGENCY DEPT Provider Note   CSN: [id number] Arrival date & time: 01/05/21  1218     History Chief Complaint  Patient presents with  . Migraine    Kathryn [name] is a 25 y.o. female who presents for evaluation of headache that she stated started this morning while at work. She reports she woke up this morning and she was in her normal state of health. She reports going to work and she started developing a mild headache that she described as a pounding type headache. She states started off mild and then continued to worsen. She reports associated photophobia, sensitivity to sound, blurry vision.  She also states that she felt like her whole body got numb and then she started having some chest pain.  She reports feeling tired.  She states she has also had some nausea/vomiting. She states that she has a history of migraines and states that this feels like her migraine but states it is a little bit worse than what she normally experiences. She has not take any medication. She states headache started off as mild and then progressively worsened. No preceding trauma, injury. She has not been sick recently with fever, chills. She reports that while she was at work, she started feeling generalized weakness and still has some.  No focal weakness noted.  She denies any abdominal pain, numbness/weakness of her arms or legs.   The history is provided by the patient.       Past Medical History:  Diagnosis Date  . Acanthosis nigricans   . Asthma   . Dyspepsia   . Goiter   . Headache   . Hidradenitis suppurativa    axilla  . Hypertension   . Isosexual precocity   . Pre-diabetes   . Thyroiditis, autoimmune     Patient Active Problem List   Diagnosis Date Noted  . Anxiety and depression 04/30/2018  . Chronic knee pain 11/21/2017  . Preventative health care 11/21/2017  . Moderate persistent asthma without complication 11/20/2016  . Status migrainosus  04/17/2015  . 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 New daily persistent headache 09/09/2014  . Migraine without aura and without status migrainosus, not intractable 09/09/2014  . Obesity 09/09/2014  . Isosexual precocity   . Acanthosis nigricans   . Goiter   . Dyspepsia   . Thyroiditis, autoimmune   . Pre-diabetes 03/04/2011  . Hypertension 03/04/2011    History reviewed. No pertinent surgical history.   OB History   No obstetric history on file.     Family History  Problem Relation Age of Onset  . Heart disease Paternal Grandmother   . Hypertension Paternal Grandmother   . Asthma Mother   . Alcohol abuse Father   . Drug abuse Father   . Asthma Sister   . Asthma Brother   . Mental illness Brother   . Cancer Maternal Grandmother   . Hypertension Maternal Grandmother   . Breast cancer Maternal Grandmother   . COPD Maternal Grandfather   . Depression Maternal Grandfather   . Learning disabilities Maternal Grandfather     Social History   Tobacco Use  . Smoking status: Never Smoker  . Smokeless tobacco: Never Used  Vaping Use  . Vaping Use: Some days  . Substances: Nicotine  Substance Use Topics  . Alcohol use: Yes  . Drug use: No    Home Medications Prior to Admission medications   Medication Sig Start Date End Date Taking? Authorizing Provider  albuterol (  PROVENTIL HFA;VENTOLIN HFA) 108 (90 Base) MCG/ACT inhaler Inhale 2 puffs into the lungs every 6 (six) hours as needed for wheezing or shortness of breath.    [provider]  albuterol (PROVENTIL) (2.5 MG/3ML) 0.083% nebulizer solution TAKE 1 VIAL WITH NEBULIZER EVERY 6 HOURS AS NEEDED FOR WHEEZING AND SHORTNESS OF BREATH Patient taking differently: Take 2.5 mg by nebulization every 6 (six) hours as needed for wheezing or shortness of breath.  06/02/18   Doreene Nest, NP  BIOTIN PO Take 2 tablets by mouth daily.    [provider]  cetirizine (ZYRTEC) 10 MG tablet Take 1 tablet by  mouth at bedtime for allergies. 04/30/18   Doreene Nest, NP  Cholecalciferol (VITAMIN D3 PO) Take 1 tablet by mouth daily.    [provider]  Cyanocobalamin (VITAMIN B12 PO) Take 1 tablet by mouth daily.    [provider]  FLUoxetine (PROZAC) 10 MG tablet Take 1 tablet by mouth every morning for anxiety and depression. 06/17/18   Doreene Nest, NP  methocarbamol (ROBAXIN) 500 MG tablet Take 1 tablet (500 mg total) by mouth 2 (two) times daily as needed for muscle spasms. 09/05/18   Ward, Chase Picket, PA-C  Multiple Vitamins-Minerals (MULTIVITAMIN GUMMIES ADULTS PO) Take 1 tablet by mouth daily.    [provider]  naproxen (NAPROSYN) 500 MG tablet Take 1 tablet (500 mg total) by mouth 2 (two) times daily as needed. 09/05/18   Ward, Chase Picket, PA-C  Norgestimate-Ethinyl Estradiol Triphasic (TRI-LO-MARZIA) 0.18/0.215/0.25 MG-25 MCG tab Take 1 tablet by mouth daily. NEED APPOINTMENT FOR ANY MORE REFILLS 06/08/19   Doreene Nest, NP  omeprazole (PRILOSEC) 10 MG capsule Take 1 capsule by mouth once daily for heartburn. NEED APPOINTMENT FOR ANY MORE REFILLS 03/16/20   Doreene Nest, NP  PATADAY 0.2 % SOLN Place 1 drop into both eyes daily.  11/14/14   [provider]  PROAIR HFA 108 (90 Base) MCG/ACT inhaler Inhale 2 puffs into the lungs every 6 (six) hours as needed for wheezing or shortness of breath. 06/17/18   Doreene Nest, NP  QVAR REDIHALER 80 MCG/ACT inhaler TAKE 2 PUFFS BY MOUTH TWICE A DAY Patient taking differently: Inhale 2 puffs into the lungs as needed (shortness of breath).  05/01/18   Doreene Nest, NP  RELPAX 40 MG tablet Take one tablet at onset of migraine with 400 mg of ibuprofen may repeat in 2 hours if headache persists or recurs. 03/26/17   Elveria Rising, NP  tiZANidine (ZANAFLEX) 2 MG tablet Take 1 tablet (2 mg total) by mouth daily. 03/26/17   Elveria Rising, NP  topiramate (TOPAMAX) 25 MG tablet TAKE 3 TABLETS IN  THE MORNING AND 3 TABLETS AT NIGHTTIME Patient taking differently: Take 75 mg by mouth as needed (migraine). TAKE 3 TABLETS IN THE MORNING AND 3 TABLETS AT NIGHTTIME 06/19/17   Elveria Rising, NP    Allergies    Patient has no known allergies.  Review of Systems   Review of Systems  Constitutional: Negative for fever.  Eyes: Positive for photophobia.  Respiratory: Negative for cough and shortness of breath.   Cardiovascular: Positive for chest pain.  Gastrointestinal: Negative for abdominal pain, nausea and vomiting.  Genitourinary: Negative for dysuria and hematuria.  Neurological: Positive for weakness (Generalized) and headaches. Negative for numbness.  All other systems reviewed and are negative.   Physical Exam Updated Vital Signs BP (!) 144/74 (BP Location: Right Arm)   Pulse 83  Temp 98.4 F (36.9 C) (Oral)   Resp (!) 22   Ht 5\' 8"  (1.727 m)   Wt 114.8 kg   LMP 12/17/2020   SpO2 98%   BMI 38.47 kg/m   Physical Exam Vitals and nursing note reviewed.  Constitutional:      Appearance: Normal appearance. She is well-developed and well-nourished.  HENT:     Head: Normocephalic and atraumatic.     Mouth/Throat:     Mouth: Oropharynx is clear and moist and mucous membranes are normal.  Eyes:     General: Lids are normal.     Extraocular Movements: EOM normal.     Conjunctiva/sclera: Conjunctivae normal.     Pupils: Pupils are equal, round, and reactive to light.     Comments: PERRL. EOMs intact. No nystagmus. No neglect.   Neck:     Comments: Neck is supple and without rigidity.  Cardiovascular:     Rate and Rhythm: Normal rate and regular rhythm.     Pulses: Normal pulses.     Heart sounds: Normal heart sounds. No murmur heard. No friction rub. No gallop.   Pulmonary:     Effort: Pulmonary effort is normal.     Breath sounds: Normal breath sounds.     Comments: Lungs clear to auscultation bilaterally.  Symmetric chest rise.  No wheezing, rales,  rhonchi. Abdominal:     Palpations: Abdomen is soft. Abdomen is not rigid.     Tenderness: There is no abdominal tenderness. There is no guarding.  Musculoskeletal:        General: Normal range of motion.     Cervical back: Full passive range of motion without pain.  Skin:    General: Skin is warm and dry.     Capillary Refill: Capillary refill takes less than 2 seconds.  Neurological:     Mental Status: She is alert and oriented to person, place, and time.     Comments: Cranial nerves III-XII intact Follows commands, Moves all extremities  5/5 strength to BUE and BLE  Sensation intact throughout all major nerve distributions No gait abnormalities  No slurred speech. No facial droop.   Psychiatric:        Mood and Affect: Mood and affect normal.        Speech: Speech normal.     ED Results / Procedures / Treatments   Labs (all labs ordered are listed, but only abnormal results are displayed) Labs Reviewed  CBC WITH DIFFERENTIAL/PLATELET - Abnormal; Notable for the following components:      Result Value   Hemoglobin 11.6 (*)    All other components within normal limits  BASIC METABOLIC PANEL - Abnormal; Notable for the following components:   Glucose, Bld 133 (*)    All other components within normal limits  I-STAT BETA HCG BLOOD, ED ([facility], [facility], [facility] ONLY)  TROPONIN I (HIGH SENSITIVITY)  TROPONIN I (HIGH SENSITIVITY)    EKG EKG Interpretation  Date/Time:  Friday January 05 2021 13:14:22 EST Ventricular Rate:  100 PR Interval:    QRS Duration: 88 QT Interval:  339 QTC Calculation: 438 R Axis:   73 Text Interpretation: Sinus tachycardia Confirmed by Norman ClayHong, Joshua ([id number]) on 01/05/2021 3:02:47 PM   Radiology DG Chest Portable 1 View  Result Date: 01/05/2021 CLINICAL DATA:  Chest pain. EXAM: PORTABLE CHEST 1 VIEW COMPARISON:  November 10, 2004. FINDINGS: The heart size and mediastinal contours are within normal limits. Both lungs are clear. No pneumothorax or pleural  effusion is  noted. The visualized skeletal structures are unremarkable. IMPRESSION: No active disease. Electronically Signed   By: Lupita Raider M.D.   On: 01/05/2021 13:49    Procedures Procedures   Medications Ordered in ED Medications  prochlorperazine (COMPAZINE) injection 10 mg (10 mg Intravenous Given 01/05/21 1311)  diphenhydrAMINE (BENADRYL) injection 25 mg (25 mg Intravenous Given 01/05/21 1311)  ketorolac (TORADOL) 30 MG/ML injection 30 mg (30 mg Intravenous Given 01/05/21 1454)    ED Course  I have reviewed the triage vital signs and the nursing notes.  Pertinent labs & imaging results that were available during my care of the patient were reviewed by me and considered in my medical decision making (see chart for details).    MDM Rules/Calculators/A&P                          25 year old female who presents for evaluation of headache that began this morning.  She reports when she woke up, she felt fine but when she was at work, she started getting headache.  Describes it as a pounding headache that started off mild and progressively worsened.  She does report she has a history of migraines and gets monthly injections.  She has not had injection this month.  She states that the quality feels the same as her migraines but states this feels slightly worse.  No preceding trauma, injury.  No fevers.  She reports that while she was at work, she felt like her body was getting weak and she had some numbness that went through her entire body.  That is since resolved.  She does report that she started developing some chest pain as well.  On initial arrival, she is afebrile, toxic appearing.  Neck is supple without rigidity.  History/physical exam not concerning for meningitis.  History/physical exam is not concerning for dural venous thrombosis.  Suspect this is migraine.  She has not had her monthly injection.  Low suspicion for ACS etiology.  History/physical exam concerning for dissection.   We will plan labs, migraine cocktail, chest x-ray.  Initial troponin is negative.  CBC shows no leukocytosis.  Hemoglobin stable 11.6.  BMP is unremarkable.  I-STAT beta is negative.  CXR negative.   Patient signed out to Evelena Leyden, PA-C with labs and re-evaluation pending.   Reevaluation.  Patient reports some improvement in headache but still is having some headache.  We will add additional Toradol.  Patient able to ambulate to the bathroom without any difficulty.   Final Clinical Impression(s) / ED Diagnoses Final diagnoses:  Other migraine without status migrainosus, not intractable  Nonspecific chest pain    Rx / DC Orders ED Discharge Orders    None       Rosana Hoes 01/05/21 1515    Cheryll Cockayne, MD 01/05/21 1525

## 2021-01-05 NOTE — ED Provider Notes (Signed)
Received patient as a handoff at shift change from Gwendalyn Ege, [place].  In short, patient is a 25 year old female with past medical history significant for chronic migraine disorder who presented to the ED with complaints of pounding headache with photophobia, sound sensitivity, nausea, and blurred vision.  She states that this feels comparable to her history of migraine.  She is followed by [facility] in [place] and was last evaluated by them on 12/20/2020.  Evidently she reports that she is overdue for her monthly Aimovig injection.  Patient also told handoff provider that she developed chest pain immediately prior to arrival.  Laboratory work-up and plain films of chest have been reviewed and unremarkable.  She has been treated with migraine cocktail.  CT head without contrast and second troponin are still in process at time of handoff.  If those are reassuring, plan is for discharge home and outpatient follow-up with her neurologist regarding today's ED encounter.  Physical Exam  BP (!) 152/70   Pulse 73   Temp 98.4 F (36.9 C) (Oral)   Resp (!) 22   Ht 5\' 8"  (1.727 m)   Wt 114.8 kg   LMP 12/17/2020   SpO2 99%   BMI 38.47 kg/m   Physical Exam  ED Course/Procedures     Procedures Results for orders placed or performed during the hospital encounter of 01/05/21  CBC with Differential  Result Value Ref Range   WBC 8.0 4.0 - 10.5 K/uL   RBC 3.98 3.87 - 5.11 MIL/uL   Hemoglobin 11.6 (L) 12.0 - 15.0 g/dL   HCT 01/07/21 02.6 - 37.8 %   MCV 90.7 80.0 - 100.0 fL   MCH 29.1 26.0 - 34.0 pg   MCHC 32.1 30.0 - 36.0 g/dL   RDW 58.8 50.2 - 77.4 %   Platelets 367 150 - 400 K/uL   nRBC 0.0 0.0 - 0.2 %   Neutrophils Relative % 87 %   Neutro Abs 6.9 1.7 - 7.7 K/uL   Lymphocytes Relative 9 %   Lymphs Abs 0.7 0.7 - 4.0 K/uL   Monocytes Relative 4 %   Monocytes Absolute 0.3 0.1 - 1.0 K/uL   Eosinophils Relative 0 %   Eosinophils Absolute 0.0 0.0 - 0.5 K/uL   Basophils Relative 0 %    Basophils Absolute 0.0 0.0 - 0.1 K/uL   Immature Granulocytes 0 %   Abs Immature Granulocytes 0.02 0.00 - 0.07 K/uL  Basic metabolic panel  Result Value Ref Range   Sodium 135 135 - 145 mmol/L   Potassium 4.4 3.5 - 5.1 mmol/L   Chloride 102 98 - 111 mmol/L   CO2 23 22 - 32 mmol/L   Glucose, Bld 133 (H) 70 - 99 mg/dL   BUN 13 6 - 20 mg/dL   Creatinine, Ser 12.8 0.44 - 1.00 mg/dL   Calcium 9.2 8.9 - 7.86 mg/dL   GFR, Estimated 76.7 >20 mL/min   Anion gap 10 5 - 15  I-Stat Beta hCG blood, ED ([facility], [facility], [facility] only)  Result Value Ref Range   I-stat hCG, quantitative <5.0 <5 mIU/mL   Comment 3          Troponin I (High Sensitivity)  Result Value Ref Range   Troponin I (High Sensitivity) 9 <18 ng/L   CT Head Wo Contrast  Result Date: 01/05/2021 CLINICAL DATA:  Headache. EXAM: CT HEAD WITHOUT CONTRAST TECHNIQUE: Contiguous axial images were obtained from the base of the skull through the vertex without intravenous contrast. COMPARISON:  CT head 09/05/2018 FINDINGS: Brain: No evidence of large-territorial acute infarction. No parenchymal hemorrhage. No mass lesion. No extra-axial collection. No mass effect or midline shift. No hydrocephalus. Basilar cisterns are patent. Vascular: No hyperdense vessel. Skull: No acute fracture or focal lesion. Sinuses/Orbits: Paranasal sinuses and mastoid air cells are clear. The orbits are unremarkable. Other: None. IMPRESSION: No acute intracranial abnormality. Electronically Signed   By: Tish Frederickson M.D.   On: 01/05/2021 15:39   DG Chest Portable 1 View  Result Date: 01/05/2021 CLINICAL DATA:  Chest pain. EXAM: PORTABLE CHEST 1 VIEW COMPARISON:  November 10, 2004. FINDINGS: The heart size and mediastinal contours are within normal limits. Both lungs are clear. No pneumothorax or pleural effusion is noted. The visualized skeletal structures are unremarkable. IMPRESSION: No active disease. Electronically Signed   By: Lupita Raider M.D.   On: 01/05/2021 13:49     MDM   CT and second troponin pending at time of handoff.  CT is personally reviewed and without any acute intracranial abnormalities.  Second troponin also is unremarkable.    Her headache symptoms are completely improved.  She states that she is back to her baseline and is ready for discharge.  Physical exam entirely unremarkable.  I agree with assessment and plan per handoff provider.  Stable for discharge.  She can follow-up with her neurologist for ongoing evaluation and management.  AVS already completed by handoff provider.       Lorelee New, PA-C 01/05/21 1640    Melene Plan, DO 01/05/21 1644

## 2021-07-09 IMAGING — CT CT HEAD W/O CM
3 of 4 series · 15 of 47 positions shown, 18 images · non-contrast
Comparison: CT head 09/05/2018

CLINICAL DATA: Headache.

EXAM:
CT HEAD WITHOUT CONTRAST
TECHNIQUE: Contiguous axial images were obtained from the base of the skull
through the vertex without intravenous contrast.

[Series 2: head wo · axial · 0.47mm/px · z∈[-101,+19]mm · 9 of 30 slices shown, 12 images]
[im 3/30  brain]
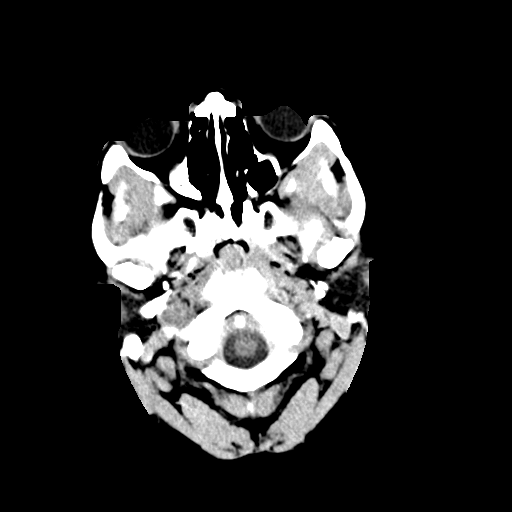
[im 3/30  bone]
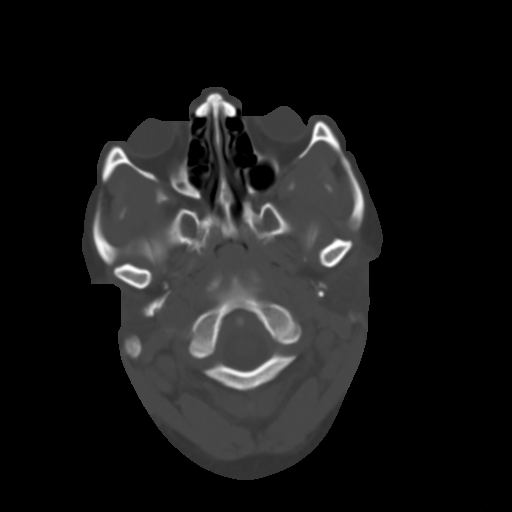
[im 6/30  brain]
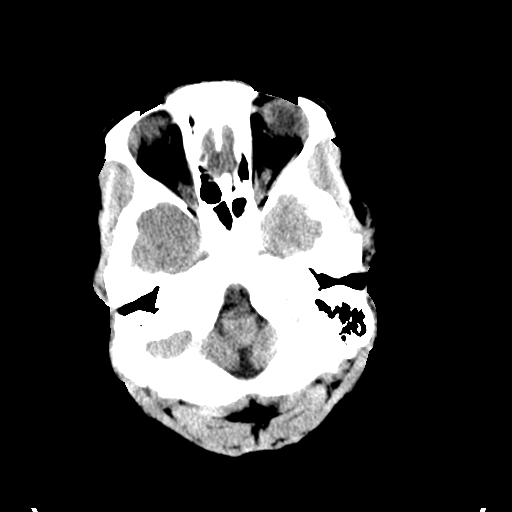
[im 9/30  brain]
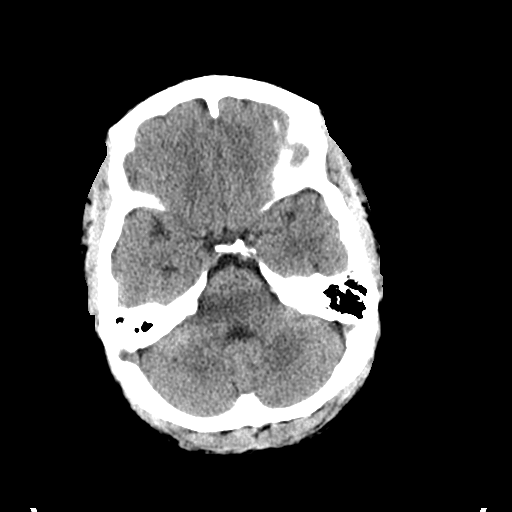
[im 12/30  brain]
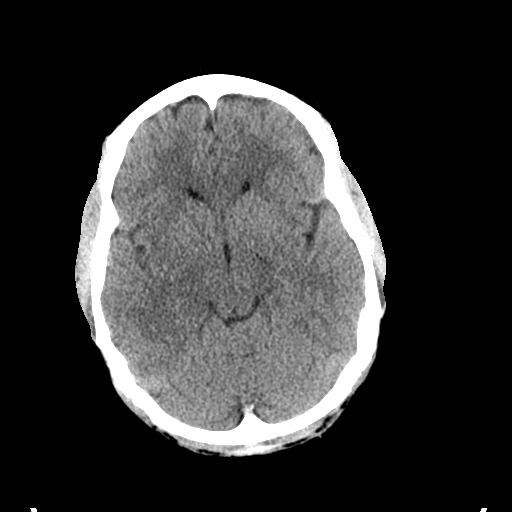
[im 15/30  brain]
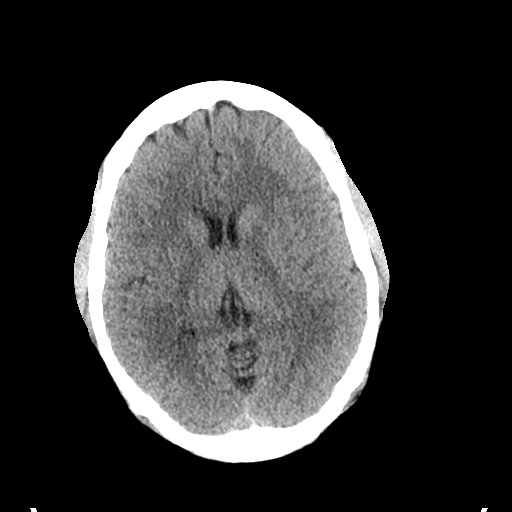
[im 15/30  bone]
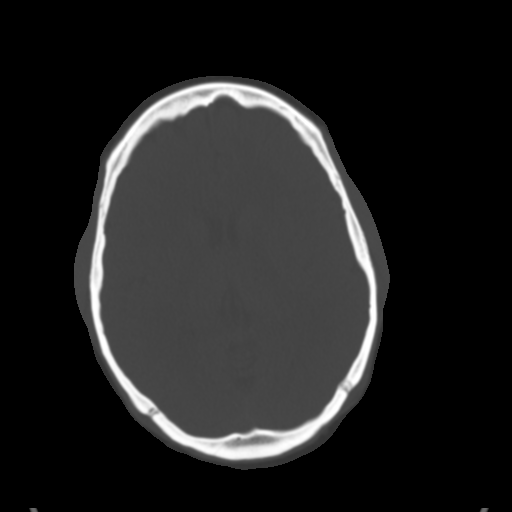
[im 18/30  brain]
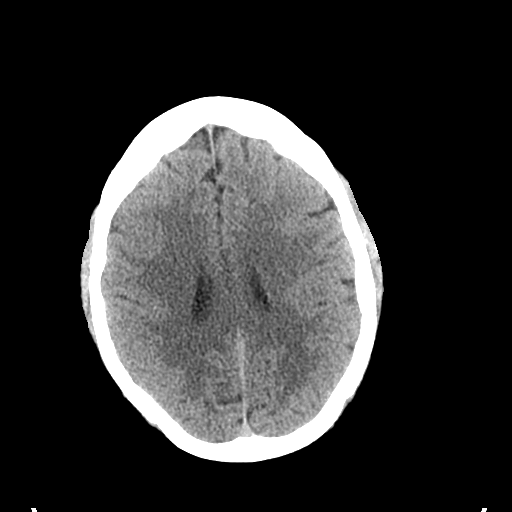
[im 21/30  brain]
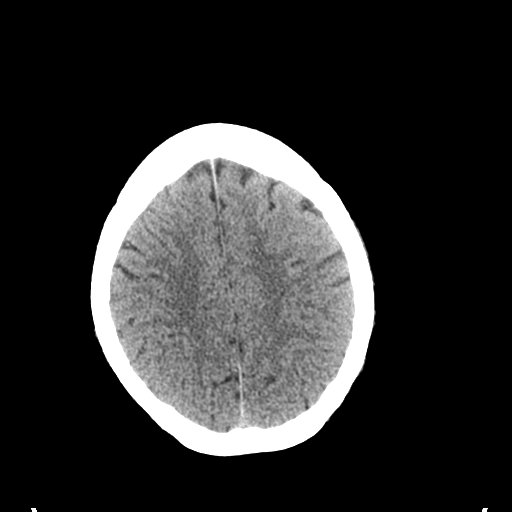
[im 24/30  brain]
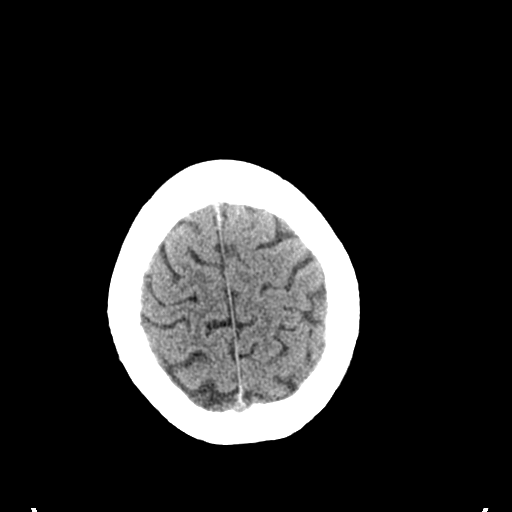
[im 27/30  brain]
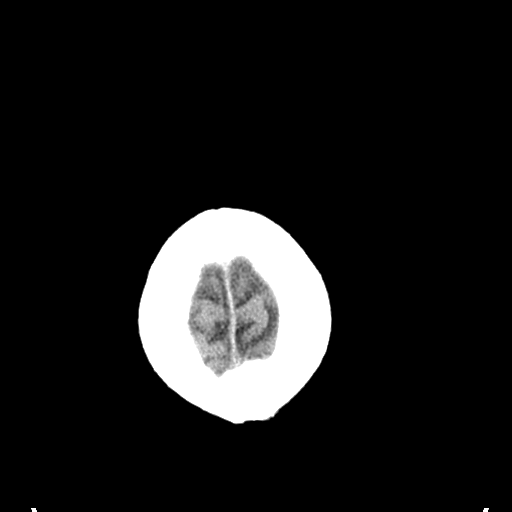
[im 27/30  bone]
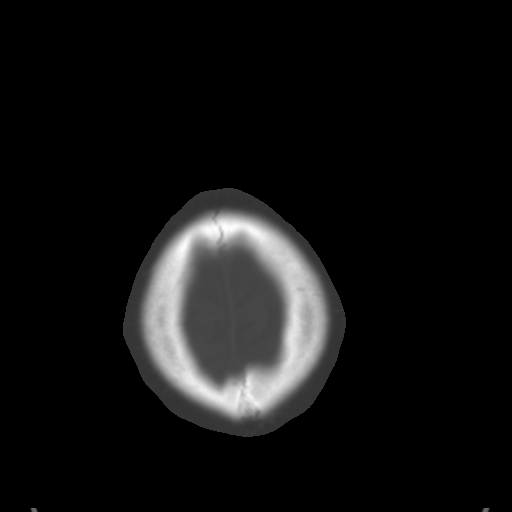

[Series 4: coronal soft tissue · coronal · 0.31mm/px · 3 of 71 slices shown]
[im 24/71  brain]
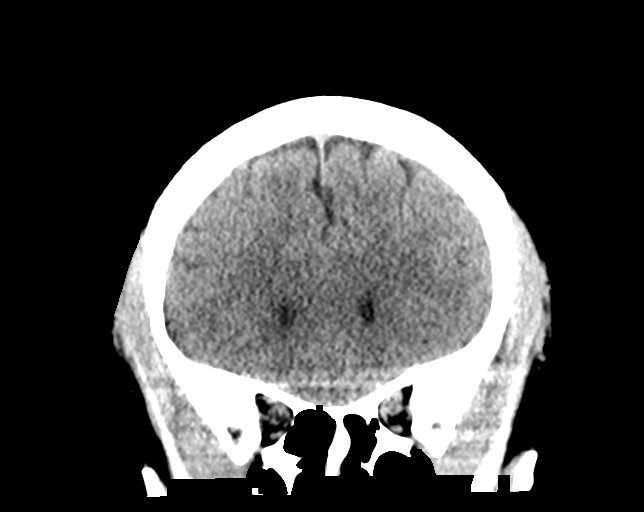
[im 32/71  brain]
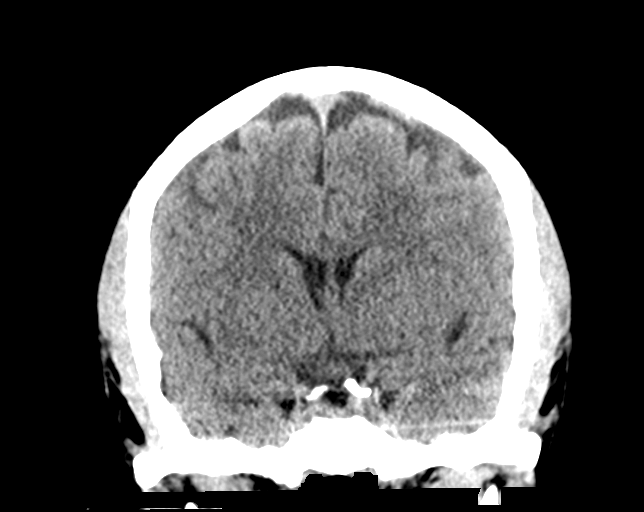
[im 39/71  brain]
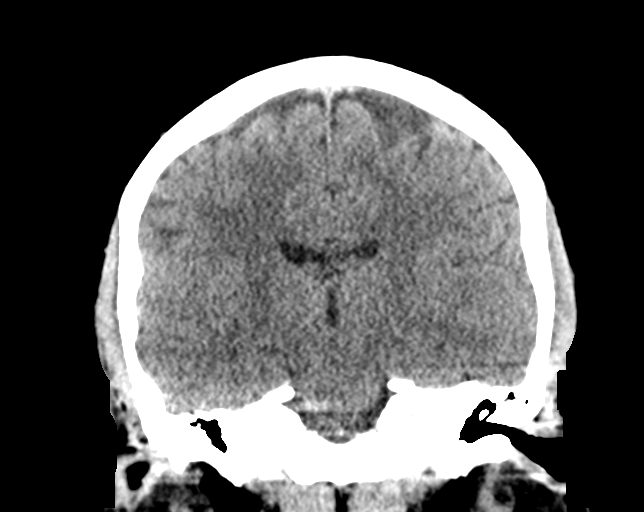

[Series 5: sagittal soft tissue · sagittal · 0.34mm/px · 3 of 62 slices shown]
[im 21/62  brain]
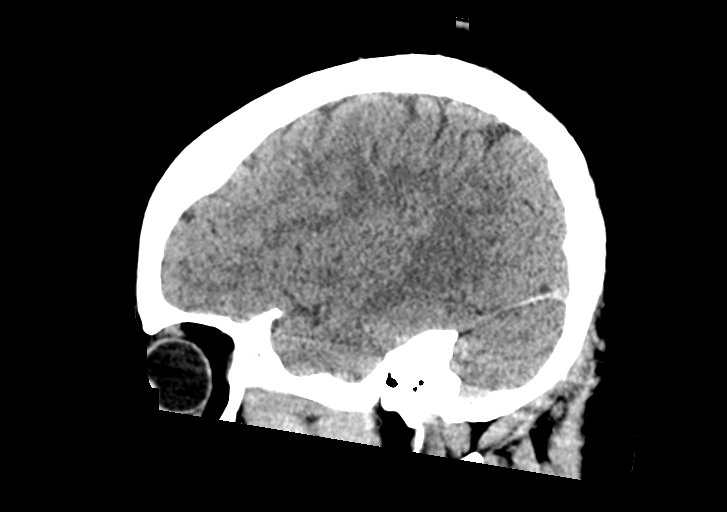
[im 31/62  brain]
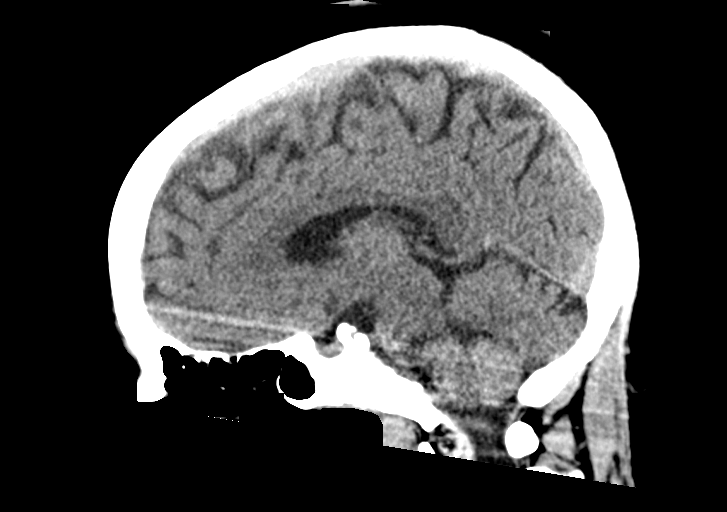
[im 41/62  brain]
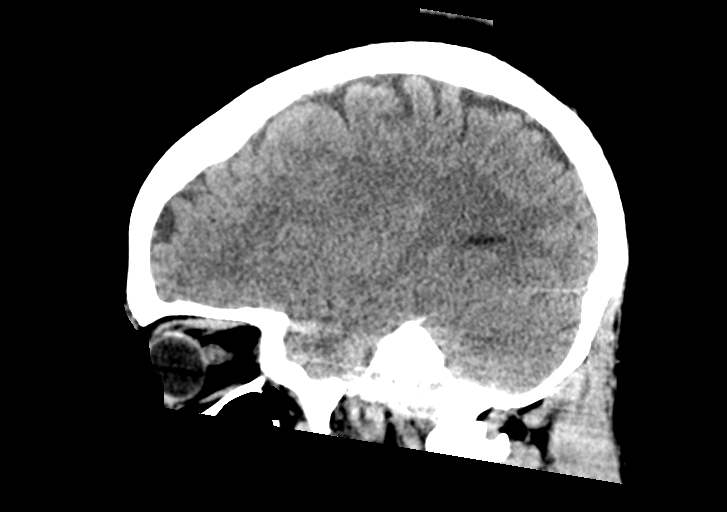

[15 of 47 positions shown; findings below may reference images not displayed]

FINDINGS: Brain:

No evidence of large-territorial acute infarction. No parenchymal
hemorrhage. No mass lesion. No extra-axial collection.

No mass effect or midline shift. No hydrocephalus. Basilar cisterns
are patent.

Vascular: No hyperdense vessel.

Skull: No acute fracture or focal lesion.

Sinuses/Orbits: Paranasal sinuses and mastoid air cells are clear.
The orbits are unremarkable.

Other: None.
IMPRESSION: No acute intracranial abnormality.

## 2022-02-08 ENCOUNTER — Other Ambulatory Visit: Payer: Self-pay

## 2022-02-08 ENCOUNTER — Inpatient Hospital Stay (HOSPITAL_COMMUNITY)
Admission: AD | Admit: 2022-02-08 | Discharge: 2022-02-08 | Disposition: A | Discharge status: Home / Self Care | Payer: BC Managed Care – PPO | Attending: Family Medicine | Admitting: Family Medicine

## 2022-02-08 ENCOUNTER — Encounter (HOSPITAL_COMMUNITY): Payer: Self-pay | Admitting: Family Medicine

## 2022-02-08 DIAGNOSIS — R1030 Lower abdominal pain, unspecified: Secondary | ICD-10-CM | POA: Insufficient documentation

## 2022-02-08 DIAGNOSIS — Z3401 Encounter for supervision of normal first pregnancy, first trimester: Secondary | ICD-10-CM | POA: Diagnosis not present

## 2022-02-08 DIAGNOSIS — Z3A19 19 weeks gestation of pregnancy: Secondary | ICD-10-CM

## 2022-02-08 DIAGNOSIS — M545 Low back pain, unspecified: Secondary | ICD-10-CM | POA: Diagnosis present

## 2022-02-08 DIAGNOSIS — O26892 Other specified pregnancy related conditions, second trimester: Secondary | ICD-10-CM

## 2022-02-08 DIAGNOSIS — O24410 Gestational diabetes mellitus in pregnancy, diet controlled: Secondary | ICD-10-CM | POA: Insufficient documentation

## 2022-02-08 HISTORY — DX: Gestational diabetes mellitus in pregnancy, unspecified control: O24.419

## 2022-02-08 HISTORY — DX: Type 2 diabetes mellitus without complications: E11.9

## 2022-02-08 LAB — URINALYSIS, ROUTINE W REFLEX MICROSCOPIC
Bilirubin Urine: NEGATIVE
Glucose, UA: NEGATIVE mg/dL
Hgb urine dipstick: NEGATIVE
Ketones, ur: 5 mg/dL — AB
Nitrite: NEGATIVE
Protein, ur: NEGATIVE mg/dL
Specific Gravity, Urine: 1.028 (ref 1.005–1.030)
pH: 6 (ref 5.0–8.0)

## 2022-02-08 LAB — WET PREP, GENITAL
Clue Cells Wet Prep HPF POC: NONE SEEN
Sperm: NONE SEEN
Trich, Wet Prep: NONE SEEN
WBC, Wet Prep HPF POC: <10 (ref <10)
Yeast Wet Prep HPF POC: NONE SEEN

## 2022-02-08 MED ORDER — ACETAMINOPHEN 500 MG PO TABS
1000.0000 mg | ORAL_TABLET | Freq: Once | ORAL | Status: AC
  Administered 2022-02-08: 1000 mg via ORAL
  Filled 2022-02-08: qty 2

## 2022-02-08 NOTE — MAU Note (Addendum)
Having pain in lower back that radiates to lower abdomen for 2 days. 19wks G1. Denies VB or vag d/c. Receives care at [facility]. GDM diet controlled.Pain is worse when she is on her feet alot ?

## 2022-02-08 NOTE — MAU Provider Note (Signed)
?History  ?  ? ?CSN: [id number] ? ?Arrival date and time: 02/08/22 1941 ? ? None  ?  ? ?Chief Complaint  ?Patient presents with  ? Abdominal Pain  ? ?HPI ?Kathryn [name] is a 26 y.o. G1P0 at [redacted]w[redacted]d who presents to MAU for pain in lower back that radiates into both buttocks and lower abdomen. Pain has been intermittent for 2 days. Pain is aggravated by being on her feet, but has not tried anything to relieve pain. She denies vaginal bleeding, discharge, urinary s/s, or itching, but reports some vaginal odor. She denies fever, constipation, diarrhea, or vomiting.  ? ?Patient receives prenatal care at [facility]. Pregnancy has been complicated by GDMA1.  ? ?OB History   ? ? Gravida  ?1  ? Para  ?   ? Term  ?   ? Preterm  ?   ? AB  ?   ? Living  ?   ?  ? ? SAB  ?   ? IAB  ?   ? Ectopic  ?   ? Multiple  ?   ? Live Births  ?   ?   ?  ?  ? ? ?Past Medical History:  ?Diagnosis Date  ? Acanthosis nigricans   ? Asthma   ? Diabetes mellitus without complication ([place])   ? Dyspepsia   ? Gestational diabetes   ? Goiter   ? Headache   ? Hidradenitis suppurativa   ? axilla  ? Hypertension   ? Isosexual precocity   ? Pre-diabetes   ? Thyroiditis, autoimmune   ? ? ?Past Surgical History:  ?Procedure Laterality Date  ? KNEE ARTHROSCOPY Right   ? ? ?Family History  ?Problem Relation Age of Onset  ? Heart disease Paternal Grandmother   ? Hypertension Paternal Grandmother   ? Asthma Mother   ? Alcohol abuse Father   ? Drug abuse Father   ? Asthma Sister   ? Asthma Brother   ? Mental illness Brother   ? Cancer Maternal Grandmother   ? Hypertension Maternal Grandmother   ? Breast cancer Maternal Grandmother   ? COPD Maternal Grandfather   ? Depression Maternal Grandfather   ? Learning disabilities Maternal Grandfather   ? ? ?Social History  ? ?Tobacco Use  ? Smoking status: Never  ? Smokeless tobacco: Never  ?Vaping Use  ? Vaping Use: Never used  ?Substance Use Topics  ? Alcohol use: Not Currently  ? Drug use: No  ? ? ?Allergies: No  Known Allergies ? ?Medications Prior to Admission  ?Medication Sig Dispense Refill Last Dose  ? BIOTIN PO Take 2 tablets by mouth daily.   02/07/2022  ? cetirizine (ZYRTEC) 10 MG tablet Take 1 tablet by mouth at bedtime for allergies. 90 tablet 3 02/08/2022  ? docusate sodium (COLACE) 100 MG capsule Take 100 mg by mouth 2 (two) times daily.     ? doxylamine, Sleep, (UNISOM) 25 MG tablet Take 25 mg by mouth at bedtime as needed.     ? omeprazole (PRILOSEC) 10 MG capsule Take 1 capsule by mouth once daily for heartburn. NEED APPOINTMENT FOR ANY MORE REFILLS 30 capsule 0 02/08/2022  ? PATADAY 0.2 % SOLN Place 1 drop into both eyes daily.   6 02/08/2022  ? Prenatal Vit-Fe Fumarate-FA (PRENATAL MULTIVITAMIN) TABS tablet Take 1 tablet by mouth daily at 12 noon.     ? pyridOXINE (VITAMIN B-6) 100 MG tablet Take 100 mg by mouth daily.     ?  albuterol (PROVENTIL HFA;VENTOLIN HFA) 108 (90 Base) MCG/ACT inhaler Inhale 2 puffs into the lungs every 6 (six) hours as needed for wheezing or shortness of breath.     ? albuterol (PROVENTIL) (2.5 MG/3ML) 0.083% nebulizer solution TAKE 1 VIAL WITH NEBULIZER EVERY 6 HOURS AS NEEDED FOR WHEEZING AND SHORTNESS OF BREATH (Patient taking differently: Take 2.5 mg by nebulization every 6 (six) hours as needed for wheezing or shortness of breath. ) 75 mL 1   ? Cholecalciferol (VITAMIN D3 PO) Take 1 tablet by mouth daily.   More than a month  ? Cyanocobalamin (VITAMIN B12 PO) Take 1 tablet by mouth daily.   More than a month  ? FLUoxetine (PROZAC) 10 MG tablet Take 1 tablet by mouth every morning for anxiety and depression. 90 tablet 1   ? methocarbamol (ROBAXIN) 500 MG tablet Take 1 tablet (500 mg total) by mouth 2 (two) times daily as needed for muscle spasms. 20 tablet 0   ? Multiple Vitamins-Minerals (MULTIVITAMIN GUMMIES ADULTS PO) Take 1 tablet by mouth daily.   More than a month  ? naproxen (NAPROSYN) 500 MG tablet Take 1 tablet (500 mg total) by mouth 2 (two) times daily as needed. 30  tablet 0   ? Norgestimate-Ethinyl Estradiol Triphasic (TRI-LO-MARZIA) 0.18/0.215/0.25 MG-25 MCG tab Take 1 tablet by mouth daily. NEED APPOINTMENT FOR ANY MORE REFILLS 1 Package 0   ? PROAIR HFA 108 (90 Base) MCG/ACT inhaler Inhale 2 puffs into the lungs every 6 (six) hours as needed for wheezing or shortness of breath. 1 Inhaler 0 More than a month  ? QVAR REDIHALER 80 MCG/ACT inhaler TAKE 2 PUFFS BY MOUTH TWICE A DAY (Patient taking differently: Inhale 2 puffs into the lungs as needed (shortness of breath). ) 10.6 g 5   ? RELPAX 40 MG tablet Take one tablet at onset of migraine with 400 mg of ibuprofen may repeat in 2 hours if headache persists or recurs. 10 tablet 5   ? tiZANidine (ZANAFLEX) 2 MG tablet Take 1 tablet (2 mg total) by mouth daily. 30 tablet 1   ? topiramate (TOPAMAX) 25 MG tablet TAKE 3 TABLETS IN THE MORNING AND 3 TABLETS AT NIGHTTIME (Patient taking differently: Take 75 mg by mouth as needed (migraine). TAKE 3 TABLETS IN THE MORNING AND 3 TABLETS AT NIGHTTIME) 186 tablet 5   ? ? ?Review of Systems  ?Constitutional: Negative.   ?Respiratory: Negative.    ?Cardiovascular: Negative.   ?Gastrointestinal:  Positive for abdominal pain. Negative for constipation, diarrhea and vomiting.  ?Genitourinary:  Negative for dysuria, frequency, vaginal bleeding and vaginal discharge.  ?     Vaginal odor ?  ?Musculoskeletal:  Positive for back pain.  ?Neurological: Negative.   ?Physical Exam  ? ?Blood pressure 137/63, pulse 79, temperature 98.7 ?F (37.1 ?C), resp. rate 20, height 5\' 9"  (1.753 m), weight 118.4 kg, last menstrual period 09/28/2021, SpO2 99 %. ? ?Physical Exam ?Vitals and nursing note reviewed. Exam conducted with a chaperone present.  ?Constitutional:   ?   General: She is not in acute distress. ?Cardiovascular:  ?   Rate and Rhythm: Normal rate.  ?Pulmonary:  ?   Effort: Pulmonary effort is normal.  ?Abdominal:  ?   Palpations: Abdomen is soft.  ?   Tenderness: There is no abdominal tenderness.   ?Genitourinary: ?   Comments: Blind swabs collected ?VE: closed/thick/posterior ?Skin: ?   General: Skin is warm and dry.  ?Neurological:  ?   General: No focal deficit  present.  ?   Mental Status: She is alert and oriented to person, place, and time.  ?Psychiatric:     ?   Mood and Affect: Mood normal.     ?   Behavior: Behavior normal.  ? ?FHR: 135 bpm via doppler ? ?MAU Course  ?Procedures ? ?MDM ?UA negative, wet prep negative, GC/CT pending. Cervix closed/thick. Patient was given 1000mg  Tylenol PO. Pain likely MSK related. ? ?Assessment and Plan  ?[redacted] weeks gestation of pregnancy ?Low back pain affecting pregnancy ? ?- Discharge home in stable condition ?- Tylenol, maternity support belt, heat/ice, stretches/back exercises, prenatal massage recommended ?- Keep OB appointment as scheduled ?- Return to MAU sooner or as needed for worsening symptoms ? ? ?Renee Harder, CNM ?02/08/2022, 9:48 PM  ?

## 2022-02-11 LAB — GC/CHLAMYDIA PROBE AMP (~~LOC~~) NOT AT ARMC
Chlamydia: NEGATIVE
Comment: NEGATIVE
Comment: NORMAL
Neisseria Gonorrhea: NEGATIVE

## 2023-07-12 ENCOUNTER — Encounter: Payer: Self-pay | Admitting: *Deleted

## 2023-07-12 ENCOUNTER — Ambulatory Visit
Admission: EM | Admit: 2023-07-12 | Discharge: 2023-07-12 | Disposition: A | Discharge status: Home / Self Care | Payer: Medicaid Other | Attending: Family Medicine | Admitting: Family Medicine

## 2023-07-12 ENCOUNTER — Other Ambulatory Visit: Payer: Self-pay

## 2023-07-12 DIAGNOSIS — J4540 Moderate persistent asthma, uncomplicated: Secondary | ICD-10-CM | POA: Diagnosis not present

## 2023-07-12 DIAGNOSIS — J45901 Unspecified asthma with (acute) exacerbation: Secondary | ICD-10-CM

## 2023-07-12 MED ORDER — PREDNISONE 20 MG PO TABS: 20.0000 mg | ORAL_TABLET | Freq: Every day | ORAL | 0 refills | Status: AC

## 2023-07-12 MED ORDER — ALBUTEROL SULFATE (2.5 MG/3ML) 0.083% IN NEBU: 2.5000 mg | INHALATION_SOLUTION | Freq: Four times a day (QID) | RESPIRATORY_TRACT | 0 refills | Status: AC | PRN

## 2023-07-12 MED ORDER — FLUTICASONE PROPIONATE 50 MCG/ACT NA SUSP: 1.0000 | Freq: Two times a day (BID) | NASAL | 0 refills | Status: AC | PRN

## 2023-07-12 NOTE — ED Provider Notes (Signed)
Renaldo Fiddler    CSN: [id number] Arrival date & time: 07/12/23  1422      History   Chief Complaint Chief Complaint  Patient presents with   Hoarse   Shortness of Breath    HPI Kathryn [name] is a 27 y.o. female.   HPI Patient with a history of hypertension, asthma, and DM presents today with 2 days of loss of voice, shortness of breath, cough, and wheezing. Reports daughter recently diagnosed with RSV and she has subsequently developed the aforementioned symptoms. She has not had fever. She endorses mild nasal congestion with clear drainage. No known exposures to anyone positive for COVID.  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   New daily persistent headache 09/09/2014   Migraine without aura and without status migrainosus, not intractable 09/09/2014   Obesity 09/09/2014   Isosexual precocity    Acanthosis nigricans    Goiter    Dyspepsia    Thyroiditis, autoimmune    Pre-diabetes 03/04/2011   Hypertension 03/04/2011    Past Surgical History:  Procedure Laterality Date   KNEE ARTHROSCOPY Right     OB History     Gravida  1   Para      Term      Preterm      AB      Living         SAB      IAB      Ectopic      Multiple      Live Births               Home Medications    Prior to Admission medications   Medication Sig Start Date End Date Taking? Authorizing Provider   BIOTIN PO Take 2 tablets by mouth daily.   Yes [provider]  cetirizine (ZYRTEC) 10 MG tablet Take 1 tablet by mouth at bedtime for allergies. 04/30/18  Yes Doreene Nest, NP  Cholecalciferol (VITAMIN D3 PO) Take 1 tablet by mouth daily.   Yes [provider]  Cyanocobalamin (VITAMIN B12 PO) Take 1 tablet by mouth daily.   Yes [provider]  docusate sodium (COLACE) 100 MG capsule Take 100 mg by mouth 2 (two) times daily.   Yes [provider]  FLUoxetine (PROZAC) 10 MG tablet Take 1 tablet by mouth every morning for anxiety and depression. 06/17/18  Yes Doreene Nest, NP  fluticasone (FLONASE) 50 MCG/ACT nasal spray Place 1 spray into both nostrils 2 (two) times daily as needed for allergies or rhinitis. 07/12/23  Yes Bing Neighbors, NP  Multiple Vitamins-Minerals (MULTIVITAMIN GUMMIES ADULTS PO) Take 1 tablet by mouth daily.   Yes [provider]  omeprazole (PRILOSEC) 10 MG capsule Take 1 capsule by mouth once daily for heartburn. NEED APPOINTMENT FOR ANY MORE REFILLS 03/16/20  Yes Doreene Nest,  NP  predniSONE (DELTASONE) 20 MG tablet Take 1 tablet (20 mg total) by mouth daily with breakfast for 5 days. 07/12/23 07/17/23 Yes Bing Neighbors, NP  Prenatal Vit-Fe Fumarate-FA (PRENATAL MULTIVITAMIN) TABS tablet Take 1 tablet by mouth daily at 12 noon.   Yes [provider]  PROAIR HFA 108 (90 Base) MCG/ACT inhaler Inhale 2 puffs into the lungs every 6 (six) hours as needed for wheezing or shortness of breath. 06/17/18  Yes Doreene Nest, NP  pyridOXINE (VITAMIN B-6) 100 MG tablet Take 100 mg by mouth daily.   Yes [provider]  QVAR REDIHALER 80 MCG/ACT inhaler TAKE 2 PUFFS BY MOUTH TWICE A DAY Patient taking differently: Inhale 2 puffs into the lungs as needed (shortness of breath). 05/01/18  Yes Doreene Nest, NP  topiramate (TOPAMAX) 25 MG tablet TAKE 3 TABLETS IN THE MORNING AND 3 TABLETS AT  NIGHTTIME Patient taking differently: Take 75 mg by mouth as needed (migraine). TAKE 3 TABLETS IN THE MORNING AND 3 TABLETS AT NIGHTTIME 06/19/17  Yes Goodpasture, Inetta Fermo, NP  albuterol (PROVENTIL) (2.5 MG/3ML) 0.083% nebulizer solution Take 3 mLs (2.5 mg total) by nebulization every 6 (six) hours as needed for wheezing or shortness of breath. 07/12/23   Bing Neighbors, NP  doxylamine, Sleep, (UNISOM) 25 MG tablet Take 25 mg by mouth at bedtime as needed.    [provider]  PATADAY 0.2 % SOLN Place 1 drop into both eyes daily.  11/14/14   [provider]    Family History Family History  Problem Relation Age of Onset   Heart disease Paternal Grandmother    Hypertension Paternal Grandmother    Asthma Mother    Alcohol abuse Father    Drug abuse Father    Asthma Sister    Asthma Brother    Mental illness Brother    Cancer Maternal Grandmother    Hypertension Maternal Grandmother    Breast cancer Maternal Grandmother    COPD Maternal Grandfather    Depression Maternal Grandfather    Learning disabilities Maternal Grandfather     Social History Social History   Tobacco Use   Smoking status: Never   Smokeless tobacco: Never  Vaping Use   Vaping status: Never Used  Substance Use Topics   Alcohol use: Not Currently   Drug use: No     Allergies   Patient has no known allergies.   Review of Systems Review of Systems Pertinent negatives listed in HPI  Physical Exam Triage Vital Signs ED Triage Vitals  Encounter Vitals Group     BP      Systolic BP Percentile      Diastolic BP Percentile      Pulse      Resp      Temp      Temp src      SpO2      Weight      Height      Head Circumference      Peak Flow      Pain Score      Pain Loc      Pain Education      Exclude from Growth Chart    No data found.  Updated Vital Signs BP 111/72   Pulse 81   Temp 98.6 F (37 C)   Resp 18   LMP 07/02/2023   SpO2 96%   Visual Acuity Right Eye  Distance:   Left Eye Distance:   Bilateral Distance:  Right Eye Near:   Left Eye Near:    Bilateral Near:     Physical Exam Vitals reviewed.  Constitutional:      Appearance: She is well-developed.  HENT:     Head: Normocephalic and atraumatic.  Cardiovascular:     Rate and Rhythm: Normal rate and regular rhythm.  Pulmonary:     Effort: Pulmonary effort is normal.     Breath sounds: Normal breath sounds.  Musculoskeletal:     Cervical back: Normal range of motion and neck supple.  Skin:    General: Skin is warm and dry.  Neurological:     General: No focal deficit present.     Mental Status: She is alert.      UC Treatments / Results  Labs (all labs ordered are listed, but only abnormal results are displayed) Labs Reviewed - No data to display  EKG   Radiology No results found.  Procedures Procedures (including critical care time)  Medications Ordered in UC Medications - No data to display  Initial Impression / Assessment and Plan / UC Course  I have reviewed the triage vital signs and the nursing notes.  Pertinent labs & imaging results that were available during my care of the patient were reviewed by me and considered in my medical decision making (see chart for details).   Asthma exacerbation. Treatment per discharge medication orders. Return precautions given if symptoms worsen or do not improve. Final Clinical Impressions(s) / UC Diagnoses   Final diagnoses:  Asthma with acute exacerbation, unspecified asthma severity, unspecified whether persistent   Discharge Instructions   None    ED Prescriptions     Medication Sig Dispense Auth. Provider   albuterol (PROVENTIL) (2.5 MG/3ML) 0.083% nebulizer solution Take 3 mLs (2.5 mg total) by nebulization every 6 (six) hours as needed for wheezing or shortness of breath. 360 mL Bing Neighbors, NP   predniSONE (DELTASONE) 20 MG tablet Take 1 tablet (20 mg total) by mouth daily with breakfast for 5  days. 5 tablet Bing Neighbors, NP   fluticasone (FLONASE) 50 MCG/ACT nasal spray Place 1 spray into both nostrils 2 (two) times daily as needed for allergies or rhinitis. 11.1 mL Bing Neighbors, NP      PDMP not reviewed this encounter.   Bing Neighbors, NP 07/16/23 1447

## 2023-07-12 NOTE — ED Triage Notes (Signed)
Pt reports since yesterday she has been hoarse and short of breath. Pt's Daughter has RSV this week.
